# Patient Record
Sex: Male | Born: 1955 | Race: White | Hispanic: No | Marital: Married | Smoking: Never smoker
Health system: Southern US, Community
[De-identification: ages and names within clinical notes are randomized; demographics above are authoritative.]

## PROBLEM LIST (undated history)

## (undated) DIAGNOSIS — S0990XA Unspecified injury of head, initial encounter: Secondary | ICD-10-CM

## (undated) DIAGNOSIS — F1010 Alcohol abuse, uncomplicated: Secondary | ICD-10-CM

## (undated) DIAGNOSIS — M1990 Unspecified osteoarthritis, unspecified site: Secondary | ICD-10-CM

## (undated) DIAGNOSIS — Z8719 Personal history of other diseases of the digestive system: Secondary | ICD-10-CM

## (undated) HISTORY — PX: KNEE ARTHROSCOPY W/ MENISCAL REPAIR: SHX1877

## (undated) HISTORY — PX: FACIAL COSMETIC SURGERY: SHX629

---

## 2013-10-01 ENCOUNTER — Other Ambulatory Visit: Payer: Self-pay | Admitting: Internal Medicine

## 2013-10-01 DIAGNOSIS — R200 Anesthesia of skin: Secondary | ICD-10-CM

## 2013-10-01 DIAGNOSIS — M542 Cervicalgia: Secondary | ICD-10-CM

## 2013-10-01 DIAGNOSIS — R531 Weakness: Secondary | ICD-10-CM

## 2013-10-03 ENCOUNTER — Ambulatory Visit
Admission: RE | Admit: 2013-10-03 | Discharge: 2013-10-03 | Disposition: A | Discharge status: Home / Self Care | Payer: 59 | Source: Ambulatory Visit | Attending: Internal Medicine | Admitting: Internal Medicine

## 2013-10-03 DIAGNOSIS — R200 Anesthesia of skin: Secondary | ICD-10-CM

## 2013-10-03 DIAGNOSIS — R531 Weakness: Secondary | ICD-10-CM

## 2013-10-03 DIAGNOSIS — M542 Cervicalgia: Secondary | ICD-10-CM

## 2013-11-15 ENCOUNTER — Other Ambulatory Visit: Payer: Self-pay | Admitting: Neurosurgery

## 2013-11-17 NOTE — Pre-Procedure Instructions (Signed)
Dezmon Kagawa  11/17/2013   Your procedure is scheduled on:  Wednesday Nov 24, 2013 at 11:58 AM.  Report to [facility] "A"  Admitting at 9:00 AM.  Call this number if you have problems the morning of surgery: [phone]   Remember:   Do not eat food or drink liquids after midnight.   Take these medicines the morning of surgery with A SIP OF WATER: Oxycodone  STOP taking Aspirin, Goody's, BC's, Aleve (Naproxen), Ibuprofen (Advil or Motrin), Fish Oil, Vitamins, Herbal Supplements or any substance that could thin your blood starting today, November 18, 2013.   Do not wear jewelry.  Do not wear lotions, powders, or colognes. You may wear deodorant.  Men may shave face and neck.  Do not bring valuables to the hospital.  [facility] is not responsible for any belongings or valuables.               Contacts, dentures or bridgework may not be worn into surgery.  Leave suitcase in the car. After surgery it may be brought to your room.  For patients admitted to the hospital, discharge time is determined by your treatment team.               Patients discharged the day of surgery will not be allowed to drive home.  Name and phone number of your driver: Family/Friend  Special Instructions: Shower using CHG soap the night before and the morning of your surgery   Please read over the following fact sheets that you were given: Pain Booklet, Coughing and Deep Breathing, MRSA Information and Surgical Site Infection Prevention

## 2013-11-18 ENCOUNTER — Encounter (HOSPITAL_COMMUNITY)
Admission: RE | Admit: 2013-11-18 | Discharge: 2013-11-18 | Disposition: A | Discharge status: Home / Self Care | Payer: 59 | Source: Ambulatory Visit | Attending: Neurosurgery | Admitting: Neurosurgery

## 2013-11-18 ENCOUNTER — Encounter (HOSPITAL_COMMUNITY): Payer: Self-pay

## 2013-11-18 DIAGNOSIS — Z01810 Encounter for preprocedural cardiovascular examination: Secondary | ICD-10-CM | POA: Insufficient documentation

## 2013-11-18 DIAGNOSIS — Z01812 Encounter for preprocedural laboratory examination: Secondary | ICD-10-CM | POA: Insufficient documentation

## 2013-11-18 HISTORY — DX: Unspecified osteoarthritis, unspecified site: M19.90

## 2013-11-18 HISTORY — DX: Personal history of other diseases of the digestive system: Z87.19

## 2013-11-18 HISTORY — DX: Unspecified injury of head, initial encounter: S09.90XA

## 2013-11-18 HISTORY — DX: Alcohol abuse, uncomplicated: F10.10

## 2013-11-18 LAB — COMPREHENSIVE METABOLIC PANEL
ALT: 29 U/L (ref 0–53)
AST: 23 U/L (ref 0–37)
Albumin: 4.1 g/dL (ref 3.5–5.2)
Alkaline Phosphatase: 77 U/L (ref 39–117)
BUN: 15 mg/dL (ref 6–23)
CO2: 24 meq/L (ref 19–32)
CREATININE: 0.84 mg/dL (ref 0.50–1.35)
Calcium: 9.4 mg/dL (ref 8.4–10.5)
Chloride: 103 mEq/L (ref 96–112)
GLUCOSE: 102 mg/dL — AB (ref 70–99)
Potassium: 4.4 mEq/L (ref 3.7–5.3)
SODIUM: 141 meq/L (ref 137–147)
Total Bilirubin: 0.3 mg/dL (ref 0.3–1.2)
Total Protein: 7.5 g/dL (ref 6.0–8.3)

## 2013-11-18 LAB — CBC
HCT: 42 % (ref 39.0–52.0)
HEMOGLOBIN: 14.1 g/dL (ref 13.0–17.0)
MCH: 32.3 pg (ref 26.0–34.0)
MCHC: 33.6 g/dL (ref 30.0–36.0)
MCV: 96.1 fL (ref 78.0–100.0)
PLATELETS: 303 10*3/uL (ref 150–400)
RBC: 4.37 MIL/uL (ref 4.22–5.81)
RDW: 13 % (ref 11.5–15.5)
WBC: 6.8 10*3/uL (ref 4.0–10.5)

## 2013-11-18 LAB — SURGICAL PCR SCREEN
MRSA, PCR: NEGATIVE
STAPHYLOCOCCUS AUREUS: NEGATIVE

## 2013-11-18 NOTE — Progress Notes (Signed)
Called Dr. Melven Sartorius office, spoke with Janett Billow regarding pt's penicillin allergy and Ancef 2g being ordered. Per Jessica/Dr. Vertell Limber, change to Vancomycin 1g (verbal order given and placed in EPIC).

## 2013-11-18 NOTE — Progress Notes (Addendum)
Pt denies being diagnosed with hypertension but had elevated BP at PAT x2 (165/109 & 169/112). Pt reports "white coat syndrome" but reports that BP is "fine" when sees PCP and denies taking medication. EKG done and placed on chart. 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 Pt also reports daily marijuana use.  Pt reports normal stress test and ECHO being performed 5-10 years ago by Dr. Janene Madeira. Pt denies ever having chest pain or shortness of breath and has not seen a cardiologist since tests were done, will attempt to obtain records.  PCP is Dr. Trilby Drummer with [facility] [phone]).

## 2013-11-19 NOTE — Progress Notes (Signed)
Message left with [facility] to see if they have copy of stress test on this patient. Requested it to be faxed if they have or call if they do not have.

## 2013-11-23 MED ORDER — VANCOMYCIN HCL IN DEXTROSE 1-5 GM/200ML-% IV SOLN
1000.0000 mg | Freq: Once | INTRAVENOUS | Status: AC
  Administered 2013-11-24: 1000 mg via INTRAVENOUS

## 2013-11-24 ENCOUNTER — Inpatient Hospital Stay (HOSPITAL_COMMUNITY): Payer: 59

## 2013-11-24 ENCOUNTER — Encounter (HOSPITAL_COMMUNITY): Payer: Self-pay | Admitting: *Deleted

## 2013-11-24 ENCOUNTER — Encounter (HOSPITAL_COMMUNITY)
Admission: RE | Disposition: A | Discharge status: Home / Self Care | Payer: Self-pay | Source: Ambulatory Visit | Attending: Neurosurgery

## 2013-11-24 ENCOUNTER — Observation Stay (HOSPITAL_COMMUNITY)
Admission: RE | Admit: 2013-11-24 | Discharge: 2013-11-25 | Disposition: A | Discharge status: Home / Self Care | Payer: 59 | Source: Ambulatory Visit | Attending: Neurosurgery | Admitting: Neurosurgery

## 2013-11-24 ENCOUNTER — Encounter (HOSPITAL_COMMUNITY): Payer: 59 | Admitting: Anesthesiology

## 2013-11-24 ENCOUNTER — Inpatient Hospital Stay (HOSPITAL_COMMUNITY): Payer: 59 | Admitting: Anesthesiology

## 2013-11-24 DIAGNOSIS — I10 Essential (primary) hypertension: Secondary | ICD-10-CM | POA: Insufficient documentation

## 2013-11-24 DIAGNOSIS — K449 Diaphragmatic hernia without obstruction or gangrene: Secondary | ICD-10-CM | POA: Insufficient documentation

## 2013-11-24 DIAGNOSIS — M4722 Other spondylosis with radiculopathy, cervical region: Secondary | ICD-10-CM

## 2013-11-24 DIAGNOSIS — M4712 Other spondylosis with myelopathy, cervical region: Secondary | ICD-10-CM | POA: Diagnosis present

## 2013-11-24 DIAGNOSIS — K2289 Other specified disease of esophagus: Secondary | ICD-10-CM | POA: Insufficient documentation

## 2013-11-24 DIAGNOSIS — K228 Other specified diseases of esophagus: Secondary | ICD-10-CM | POA: Insufficient documentation

## 2013-11-24 DIAGNOSIS — M5000 Cervical disc disorder with myelopathy, unspecified cervical region: Secondary | ICD-10-CM | POA: Insufficient documentation

## 2013-11-24 HISTORY — PX: ANTERIOR CERVICAL DECOMP/DISCECTOMY FUSION: SHX1161

## 2013-11-24 SURGERY — ANTERIOR CERVICAL DECOMPRESSION/DISCECTOMY FUSION 3 LEVELS
Anesthesia: General | Site: Neck

## 2013-11-24 MED ORDER — PROPOFOL 10 MG/ML IV BOLUS
INTRAVENOUS | Status: AC
  Filled 2013-11-24: qty 20

## 2013-11-24 MED ORDER — ONDANSETRON HCL 4 MG/2ML IJ SOLN: 4.0000 mg | INTRAMUSCULAR | Status: DC | PRN

## 2013-11-24 MED ORDER — MENTHOL 3 MG MT LOZG: 1.0000 | LOZENGE | OROMUCOSAL | Status: DC | PRN

## 2013-11-24 MED ORDER — MIDAZOLAM HCL 5 MG/5ML IJ SOLN
INTRAMUSCULAR | Status: DC | PRN
  Administered 2013-11-24: 2 mg via INTRAVENOUS

## 2013-11-24 MED ORDER — FENTANYL CITRATE 0.05 MG/ML IJ SOLN
INTRAMUSCULAR | Status: AC
  Filled 2013-11-24: qty 5

## 2013-11-24 MED ORDER — ROCURONIUM BROMIDE 50 MG/5ML IV SOLN
INTRAVENOUS | Status: AC
  Filled 2013-11-24: qty 1

## 2013-11-24 MED ORDER — THROMBIN 20000 UNITS EX SOLR
CUTANEOUS | Status: DC | PRN
  Administered 2013-11-24: 12:00:00 via TOPICAL

## 2013-11-24 MED ORDER — DEXAMETHASONE SODIUM PHOSPHATE 10 MG/ML IJ SOLN
INTRAMUSCULAR | Status: AC
  Filled 2013-11-24: qty 1

## 2013-11-24 MED ORDER — ONDANSETRON HCL 4 MG/2ML IJ SOLN
INTRAMUSCULAR | Status: DC | PRN
  Administered 2013-11-24: 4 mg via INTRAVENOUS

## 2013-11-24 MED ORDER — SODIUM CHLORIDE 0.9 % IJ SOLN: 3.0000 mL | INTRAMUSCULAR | Status: DC | PRN

## 2013-11-24 MED ORDER — HYDROMORPHONE HCL PF 1 MG/ML IJ SOLN
0.2500 mg | INTRAMUSCULAR | Status: DC | PRN
  Administered 2013-11-24 (×4): 0.5 mg via INTRAVENOUS

## 2013-11-24 MED ORDER — GLYCOPYRROLATE 0.2 MG/ML IJ SOLN
INTRAMUSCULAR | Status: DC | PRN
  Administered 2013-11-24: 0.4 mg via INTRAVENOUS

## 2013-11-24 MED ORDER — OXYCODONE HCL 5 MG PO CAPS: 5.0000 mg | ORAL_CAPSULE | ORAL | Status: DC | PRN

## 2013-11-24 MED ORDER — DIAZEPAM 5 MG PO TABS
ORAL_TABLET | ORAL | Status: AC
  Filled 2013-11-24: qty 1

## 2013-11-24 MED ORDER — HYDROCODONE-ACETAMINOPHEN 5-325 MG PO TABS
1.0000 | ORAL_TABLET | ORAL | Status: DC | PRN
  Administered 2013-11-25: 2 via ORAL
  Filled 2013-11-24: qty 2

## 2013-11-24 MED ORDER — DIAZEPAM 5 MG PO TABS
5.0000 mg | ORAL_TABLET | Freq: Four times a day (QID) | ORAL | Status: DC | PRN
  Administered 2013-11-24 – 2013-11-25 (×4): 5 mg via ORAL
  Filled 2013-11-24 (×3): qty 1

## 2013-11-24 MED ORDER — PROPOFOL 10 MG/ML IV BOLUS
INTRAVENOUS | Status: DC | PRN
  Administered 2013-11-24: 200 mg via INTRAVENOUS

## 2013-11-24 MED ORDER — ALUM & MAG HYDROXIDE-SIMETH 200-200-20 MG/5ML PO SUSP: 30.0000 mL | Freq: Four times a day (QID) | ORAL | Status: DC | PRN

## 2013-11-24 MED ORDER — ONDANSETRON HCL 4 MG/2ML IJ SOLN
INTRAMUSCULAR | Status: AC
  Filled 2013-11-24: qty 2

## 2013-11-24 MED ORDER — LACTATED RINGERS IV SOLN
INTRAVENOUS | Status: DC
  Administered 2013-11-24 (×2): via INTRAVENOUS

## 2013-11-24 MED ORDER — NEOSTIGMINE METHYLSULFATE 10 MG/10ML IV SOLN
INTRAVENOUS | Status: DC | PRN
  Administered 2013-11-24: 3 mg via INTRAVENOUS

## 2013-11-24 MED ORDER — OXYCODONE-ACETAMINOPHEN 5-325 MG PO TABS
ORAL_TABLET | ORAL | Status: AC
  Filled 2013-11-24: qty 2

## 2013-11-24 MED ORDER — LIDOCAINE HCL (CARDIAC) 20 MG/ML IV SOLN
INTRAVENOUS | Status: DC | PRN
  Administered 2013-11-24: 40 mg via INTRAVENOUS

## 2013-11-24 MED ORDER — LIDOCAINE HCL 4 % MT SOLN
OROMUCOSAL | Status: DC | PRN
  Administered 2013-11-24: 4 mL via TOPICAL

## 2013-11-24 MED ORDER — ONDANSETRON HCL 4 MG/2ML IJ SOLN: 4.0000 mg | Freq: Once | INTRAMUSCULAR | Status: DC | PRN

## 2013-11-24 MED ORDER — ACETAMINOPHEN 325 MG PO TABS: 650.0000 mg | ORAL_TABLET | ORAL | Status: DC | PRN

## 2013-11-24 MED ORDER — PANTOPRAZOLE SODIUM 40 MG PO TBEC
40.0000 mg | DELAYED_RELEASE_TABLET | Freq: Every day | ORAL | Status: DC
  Administered 2013-11-24 – 2013-11-25 (×2): 40 mg via ORAL
  Filled 2013-11-24 (×2): qty 1

## 2013-11-24 MED ORDER — KCL IN DEXTROSE-NACL 20-5-0.45 MEQ/L-%-% IV SOLN
INTRAVENOUS | Status: DC
  Filled 2013-11-24 (×3): qty 1000

## 2013-11-24 MED ORDER — OXYCODONE-ACETAMINOPHEN 5-325 MG PO TABS
1.0000 | ORAL_TABLET | ORAL | Status: DC | PRN
  Administered 2013-11-24 – 2013-11-25 (×4): 2 via ORAL
  Filled 2013-11-24 (×3): qty 2

## 2013-11-24 MED ORDER — THROMBIN 5000 UNITS EX SOLR
OROMUCOSAL | Status: DC | PRN
  Administered 2013-11-24: 13:00:00 via TOPICAL

## 2013-11-24 MED ORDER — NEOSTIGMINE METHYLSULFATE 10 MG/10ML IV SOLN
INTRAVENOUS | Status: AC
  Filled 2013-11-24: qty 1

## 2013-11-24 MED ORDER — SENNA 8.6 MG PO TABS
1.0000 | ORAL_TABLET | Freq: Two times a day (BID) | ORAL | Status: DC
  Administered 2013-11-24 – 2013-11-25 (×2): 8.6 mg via ORAL
  Filled 2013-11-24 (×3): qty 1

## 2013-11-24 MED ORDER — DOCUSATE SODIUM 100 MG PO CAPS
100.0000 mg | ORAL_CAPSULE | Freq: Two times a day (BID) | ORAL | Status: DC
  Administered 2013-11-24 – 2013-11-25 (×2): 100 mg via ORAL
  Filled 2013-11-24 (×3): qty 1

## 2013-11-24 MED ORDER — LIDOCAINE HCL (CARDIAC) 20 MG/ML IV SOLN
INTRAVENOUS | Status: AC
  Filled 2013-11-24: qty 5

## 2013-11-24 MED ORDER — HYDROMORPHONE HCL PF 1 MG/ML IJ SOLN
INTRAMUSCULAR | Status: AC
  Filled 2013-11-24: qty 1

## 2013-11-24 MED ORDER — SODIUM CHLORIDE 0.9 % IJ SOLN
3.0000 mL | Freq: Two times a day (BID) | INTRAMUSCULAR | Status: DC
  Administered 2013-11-24: 3 mL via INTRAVENOUS

## 2013-11-24 MED ORDER — BUPIVACAINE HCL (PF) 0.5 % IJ SOLN
INTRAMUSCULAR | Status: DC | PRN
  Administered 2013-11-24: 5 mL

## 2013-11-24 MED ORDER — MORPHINE SULFATE 2 MG/ML IJ SOLN
1.0000 mg | INTRAMUSCULAR | Status: DC | PRN
  Administered 2013-11-24: 4 mg via INTRAVENOUS
  Administered 2013-11-25: 2 mg via INTRAVENOUS
  Filled 2013-11-24: qty 2
  Filled 2013-11-24: qty 1

## 2013-11-24 MED ORDER — FLEET ENEMA 7-19 GM/118ML RE ENEM
1.0000 | ENEMA | Freq: Once | RECTAL | Status: AC | PRN
  Filled 2013-11-24: qty 1

## 2013-11-24 MED ORDER — GLYCOPYRROLATE 0.2 MG/ML IJ SOLN
INTRAMUSCULAR | Status: AC
  Filled 2013-11-24: qty 3

## 2013-11-24 MED ORDER — BISACODYL 10 MG RE SUPP: 10.0000 mg | Freq: Every day | RECTAL | Status: DC | PRN

## 2013-11-24 MED ORDER — OXYCODONE HCL 5 MG PO TABS: 5.0000 mg | ORAL_TABLET | ORAL | Status: DC | PRN

## 2013-11-24 MED ORDER — PHENOL 1.4 % MT LIQD
1.0000 | OROMUCOSAL | Status: DC | PRN
  Filled 2013-11-24: qty 177

## 2013-11-24 MED ORDER — VANCOMYCIN HCL IN DEXTROSE 1-5 GM/200ML-% IV SOLN
INTRAVENOUS | Status: AC
  Filled 2013-11-24: qty 200

## 2013-11-24 MED ORDER — ROCURONIUM BROMIDE 100 MG/10ML IV SOLN
INTRAVENOUS | Status: DC | PRN
  Administered 2013-11-24: 50 mg via INTRAVENOUS
  Administered 2013-11-24: 20 mg via INTRAVENOUS

## 2013-11-24 MED ORDER — FENTANYL CITRATE 0.05 MG/ML IJ SOLN
INTRAMUSCULAR | Status: DC | PRN
  Administered 2013-11-24 (×3): 50 ug via INTRAVENOUS
  Administered 2013-11-24: 150 ug via INTRAVENOUS
  Administered 2013-11-24: 50 ug via INTRAVENOUS

## 2013-11-24 MED ORDER — 0.9 % SODIUM CHLORIDE (POUR BTL) OPTIME
TOPICAL | Status: DC | PRN
  Administered 2013-11-24: 1000 mL

## 2013-11-24 MED ORDER — LIDOCAINE-EPINEPHRINE 1 %-1:100000 IJ SOLN
INTRAMUSCULAR | Status: DC | PRN
  Administered 2013-11-24: 5 mL

## 2013-11-24 MED ORDER — MIDAZOLAM HCL 2 MG/2ML IJ SOLN
INTRAMUSCULAR | Status: AC
  Filled 2013-11-24: qty 2

## 2013-11-24 MED ORDER — ACETAMINOPHEN 650 MG RE SUPP: 650.0000 mg | RECTAL | Status: DC | PRN

## 2013-11-24 MED ORDER — PANTOPRAZOLE SODIUM 40 MG IV SOLR
40.0000 mg | Freq: Every day | INTRAVENOUS | Status: DC
  Filled 2013-11-24: qty 40

## 2013-11-24 MED ORDER — DEXAMETHASONE SODIUM PHOSPHATE 10 MG/ML IJ SOLN
INTRAMUSCULAR | Status: DC | PRN
  Administered 2013-11-24: 10 mg via INTRAVENOUS

## 2013-11-24 MED ORDER — POLYETHYLENE GLYCOL 3350 17 G PO PACK
17.0000 g | PACK | Freq: Every day | ORAL | Status: DC | PRN
  Filled 2013-11-24: qty 1

## 2013-11-24 SURGICAL SUPPLY — 82 items
BAG DECANTER FOR FLEXI CONT (MISCELLANEOUS) ×2 IMPLANT
BANDAGE GAUZE ELAST BULKY 4 IN (GAUZE/BANDAGES/DRESSINGS) ×4 IMPLANT
BENZOIN TINCTURE PRP APPL 2/3 (GAUZE/BANDAGES/DRESSINGS) IMPLANT
BIT DRILL NEURO 2X3.1 SFT TUCH (MISCELLANEOUS) ×1 IMPLANT
BIT DRILL POWER (BIT) ×1 IMPLANT
BLADE 10 SAFETY STRL DISP (BLADE) IMPLANT
BLADE ULTRA TIP 2M (BLADE) ×2 IMPLANT
BONE MATRIX OSTEOCEL PRO SM (Bone Implant) ×4 IMPLANT
BUR BARREL STRAIGHT FLUTE 4.0 (BURR) ×2 IMPLANT
CAGE SMALL 7X13X15 (Cage) ×6 IMPLANT
CANISTER SUCT 3000ML (MISCELLANEOUS) ×2 IMPLANT
CONT SPEC 4OZ CLIKSEAL STRL BL (MISCELLANEOUS) ×2 IMPLANT
COVER MAYO STAND STRL (DRAPES) ×2 IMPLANT
DERMABOND ADHESIVE PROPEN (GAUZE/BANDAGES/DRESSINGS) ×1
DERMABOND ADVANCED (GAUZE/BANDAGES/DRESSINGS)
DERMABOND ADVANCED .7 DNX12 (GAUZE/BANDAGES/DRESSINGS) IMPLANT
DERMABOND ADVANCED .7 DNX6 (GAUZE/BANDAGES/DRESSINGS) ×1 IMPLANT
DRAIN CHANNEL 7F 3/4 FLAT (WOUND CARE) ×2 IMPLANT
DRAIN SNY WOU 7FLT (WOUND CARE) ×2 IMPLANT
DRAPE LAPAROTOMY 100X72 PEDS (DRAPES) ×2 IMPLANT
DRAPE MICROSCOPE LEICA (MISCELLANEOUS) ×2 IMPLANT
DRAPE POUCH INSTRU U-SHP 10X18 (DRAPES) ×2 IMPLANT
DRAPE PROXIMA HALF (DRAPES) ×2 IMPLANT
DRESSING TELFA 8X3 (GAUZE/BANDAGES/DRESSINGS) IMPLANT
DRILL BIT POWER (BIT) ×1
DRILL NEURO 2X3.1 SOFT TOUCH (MISCELLANEOUS) ×2
DRSG OPSITE 4X5.5 SM (GAUZE/BANDAGES/DRESSINGS) ×2 IMPLANT
DURAPREP 6ML APPLICATOR 50/CS (WOUND CARE) ×2 IMPLANT
ELECT COATED BLADE 2.86 ST (ELECTRODE) ×2 IMPLANT
ELECT REM PT RETURN 9FT ADLT (ELECTROSURGICAL) ×2
ELECTRODE REM PT RTRN 9FT ADLT (ELECTROSURGICAL) ×1 IMPLANT
EVACUATOR SILICONE 100CC (DRAIN) ×2 IMPLANT
GAUZE SPONGE 4X4 16PLY XRAY LF (GAUZE/BANDAGES/DRESSINGS) IMPLANT
GLOVE BIO SURGEON STRL SZ8 (GLOVE) ×4 IMPLANT
GLOVE BIOGEL PI IND STRL 7.5 (GLOVE) ×3 IMPLANT
GLOVE BIOGEL PI IND STRL 8 (GLOVE) ×1 IMPLANT
GLOVE BIOGEL PI IND STRL 8.5 (GLOVE) ×1 IMPLANT
GLOVE BIOGEL PI INDICATOR 7.5 (GLOVE) ×3
GLOVE BIOGEL PI INDICATOR 8 (GLOVE) ×1
GLOVE BIOGEL PI INDICATOR 8.5 (GLOVE) ×1
GLOVE ECLIPSE 7.5 STRL STRAW (GLOVE) ×6 IMPLANT
GLOVE ECLIPSE 8.0 STRL XLNG CF (GLOVE) ×2 IMPLANT
GLOVE EXAM NITRILE LRG STRL (GLOVE) ×2 IMPLANT
GLOVE EXAM NITRILE MD LF STRL (GLOVE) IMPLANT
GLOVE EXAM NITRILE XL STR (GLOVE) IMPLANT
GLOVE EXAM NITRILE XS STR PU (GLOVE) IMPLANT
GLOVE INDICATOR 8.5 STRL (GLOVE) ×2 IMPLANT
GLOVE SURG SS PI 7.0 STRL IVOR (GLOVE) ×4 IMPLANT
GOWN BRE IMP SLV AUR LG STRL (GOWN DISPOSABLE) IMPLANT
GOWN BRE IMP SLV AUR XL STRL (GOWN DISPOSABLE) IMPLANT
GOWN STRL REIN 2XL LVL4 (GOWN DISPOSABLE) IMPLANT
GOWN STRL REUS W/ TWL LRG LVL3 (GOWN DISPOSABLE) ×1 IMPLANT
GOWN STRL REUS W/ TWL XL LVL3 (GOWN DISPOSABLE) ×2 IMPLANT
GOWN STRL REUS W/TWL 2XL LVL3 (GOWN DISPOSABLE) ×4 IMPLANT
GOWN STRL REUS W/TWL LRG LVL3 (GOWN DISPOSABLE) ×1
GOWN STRL REUS W/TWL XL LVL3 (GOWN DISPOSABLE) ×2
HALTER HD/CHIN CERV TRACTION D (MISCELLANEOUS) ×2 IMPLANT
KIT BASIN OR (CUSTOM PROCEDURE TRAY) ×2 IMPLANT
KIT ROOM TURNOVER OR (KITS) ×2 IMPLANT
NEEDLE HYPO 25X1 1.5 SAFETY (NEEDLE) ×2 IMPLANT
NEEDLE SPNL 18GX3.5 QUINCKE PK (NEEDLE) IMPLANT
NEEDLE SPNL 22GX3.5 QUINCKE BK (NEEDLE) ×2 IMPLANT
NS IRRIG 1000ML POUR BTL (IV SOLUTION) ×2 IMPLANT
PACK LAMINECTOMY NEURO (CUSTOM PROCEDURE TRAY) ×2 IMPLANT
PAD ARMBOARD 7.5X6 YLW CONV (MISCELLANEOUS) ×6 IMPLANT
PIN DISTRACTION 14MM (PIN) ×4 IMPLANT
PLATE ARCHON 3-LEVEL 62MM (Plate) ×2 IMPLANT
RUBBERBAND STERILE (MISCELLANEOUS) ×4 IMPLANT
SCREW ARCHON SELFTAP 4.0X13 (Screw) ×16 IMPLANT
SPONGE GAUZE 4X4 12PLY (GAUZE/BANDAGES/DRESSINGS) ×2 IMPLANT
SPONGE INTESTINAL PEANUT (DISPOSABLE) ×4 IMPLANT
SPONGE SURGIFOAM ABS GEL 100 (HEMOSTASIS) ×2 IMPLANT
STAPLER SKIN PROX WIDE 3.9 (STAPLE) ×2 IMPLANT
STRIP CLOSURE SKIN 1/2X4 (GAUZE/BANDAGES/DRESSINGS) IMPLANT
SUT VIC AB 3-0 SH 8-18 (SUTURE) ×4 IMPLANT
SYR 20ML ECCENTRIC (SYRINGE) ×2 IMPLANT
SYR 5ML LL (SYRINGE) IMPLANT
TOWEL OR 17X24 6PK STRL BLUE (TOWEL DISPOSABLE) ×2 IMPLANT
TOWEL OR 17X26 10 PK STRL BLUE (TOWEL DISPOSABLE) ×2 IMPLANT
TRAP SPECIMEN MUCOUS 40CC (MISCELLANEOUS) ×2 IMPLANT
TRAY FOLEY CATH 14FRSI W/METER (CATHETERS) IMPLANT
WATER STERILE IRR 1000ML POUR (IV SOLUTION) ×2 IMPLANT

## 2013-11-24 NOTE — Brief Op Note (Signed)
11/24/2013  1:52 PM  PATIENT:  Kurt Avila  58 y.o. 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  PROCEDURE:  Procedure(s) with comments: Cervical three-four, Cervical four-five,  Cervical five-six Anterior cervical decompression/diskectomy/fusion (N/A) - Cervical three-four, Cervical four-five,  Cervical five-six Anterior cervical decompression/diskectomy/fusion  SURGEON:  Surgeon(s) and Role:    * Erline Levine, MD - Primary    * Elaina Hoops, MD - Assisting  PHYSICIAN ASSISTANT:   ASSISTANTS: Poteat, RN   ANESTHESIA:   general  EBL:  Total I/O In: 1000 [I.V.:1000] Out: 200 [Blood:200]  BLOOD ADMINISTERED:none  DRAINS: none and (7) Jackson-Pratt drain(s) with closed bulb suction in the prevertebral   LOCAL MEDICATIONS USED:  XYLOCAINE   SPECIMEN:  No Specimen  DISPOSITION OF SPECIMEN:  N/A  COUNTS:  YES  TOURNIQUET:  * No tourniquets in log *  DICTATION: DICTATION: Patient was brought to operating room and following the smooth and uncomplicated induction of general endotracheal anesthesia his head was placed on a horseshoe head holder he was placed in 5 pounds of Holter traction and his anterior neck was prepped and draped in usual sterile fashion. An incision was made on the left side of midline after infiltrating the skin and subcutaneous tissues with local lidocaine. The platysmal layer was incised and subplatysmal dissection was performed exposing the anterior border sternocleidomastoid muscle. Using blunt dissection the carotid sheath was kept lateral and trachea and esophagus kept medial exposing the anterior cervical spine. A bent spinal needle was placed it  was felt to be the C 34 and C 45 levels and this was confirmed on intraoperative x-ray. Longus coli muscles were taken down from the anterior cervical spine using electrocautery and key elevator and self-retaining retractor was placed. The interspace at C 34 was incised and a thorough discectomy was performed. Distraction pins were placed. Uncinate spurs and central spondylitic ridges were drilled down with a high-speed drill. The spinal cord dura and both C4 nerve roots were widely decompressed. Hemostasis was assured. After trial sizing a lordotic 7 mm peek interbody cage was selected and packed with allograft and autograft. The graft was tamped into position and countersunk appropriately. The retractor was moved and the interspace at C 45 was incised and a thorough discectomy was performed. Distraction pins were placed. Uncinate spurs and central spondylitic ridges were drilled down with a high-speed drill. The spinal cord dura and both C6 nerve roots were widely decompressed. Hemostasis was assured. After trial sizing an 7 mm peek interbody cage was selected and packed in a similar fashion. The graft was tamped into position and countersunk appropriately.The interspace at C 56was incised and a thorough discectomy was performed. Distraction pins were placed. Uncinate spurs and central spondylitic ridges were drilled down with a high-speed drill. The spinal cord dura and both C 6 nerve roots were widely decompressed. Marland Kitchen Hemostasis was assured. After trial sizing a 7 mm peek interbody cage was selected and packed in a similar fashion. The graft was tamped into position and countersunk appropriately.  Distraction weight was removed. A 62 mm Nuvasive Archon anterior cervical plate was affixed to the cervical spine with 13 mm variable-angle screws 2 at C3, 2 at C4, 2 at C5,  and 2 at C6. All  screws were well-positioned and locking mechanisms were engaged. Soft tissues were inspected and found to be in good repair. The  wound was irrigated. A final x-ray was obtained with good visualization to C 5 with the interbody graft well visualized. A # 7 JP drain was placed in the prevertebral space. The platysma layer was closed with 3-0 Vicryl stitches and the skin was reapproximated with 3-0 Vicryl subcuticular stitches. The wound was dressed with Dermabond. Counts were correct at the end of the case. Patient was extubated and taken to recovery in stable and satisfactory condition.     PLAN OF CARE: Admit for overnight observation  PATIENT DISPOSITION:  PACU - hemodynamically stable.   Delay start of Pharmacological VTE agent (>24hrs) due to surgical blood loss or risk of bleeding: yes

## 2013-11-24 NOTE — Anesthesia Preprocedure Evaluation (Signed)
Anesthesia Evaluation  Patient identified by MRN, date of birth, ID band Patient awake    Reviewed: Allergy & Precautions, H&P , NPO status , Patient's Chart, lab work & pertinent test results  Airway       Dental   Pulmonary          Cardiovascular hypertension,     Neuro/Psych    GI/Hepatic hiatal hernia, (+)     substance abuse  alcohol use,   Endo/Other    Renal/GU      Musculoskeletal   Abdominal   Peds  Hematology   Anesthesia Other Findings   Reproductive/Obstetrics                           Anesthesia Physical Anesthesia Plan  ASA: II  Anesthesia Plan: General   Post-op Pain Management:    Induction: Intravenous  Airway Management Planned: Oral ETT  Additional Equipment:   Intra-op Plan:   Post-operative Plan: Extubation in OR  Informed Consent: I have reviewed the patients History and Physical, chart, labs and discussed the procedure including the risks, benefits and alternatives for the proposed anesthesia with the patient or authorized representative who has indicated his/her understanding and acceptance.     Plan Discussed with:   Anesthesia Plan Comments:         Anesthesia Quick Evaluation

## 2013-11-24 NOTE — Progress Notes (Signed)
Awake, alert, conversant.  Full strength bilateral D/B/T.  Doing well.

## 2013-11-24 NOTE — Anesthesia Postprocedure Evaluation (Signed)
  Anesthesia Post-op Note  Patient: Kurt Avila  Procedure(s) Performed: Procedure(s) with comments: Cervical three-four, Cervical four-five,  Cervical five-six Anterior cervical decompression/diskectomy/fusion (N/A) - Cervical three-four, Cervical four-five,  Cervical five-six Anterior cervical decompression/diskectomy/fusion  Patient Location: PACU  Anesthesia Type:General  Level of Consciousness: awake, alert , oriented and patient cooperative  Airway and Oxygen Therapy: Patient Spontanous Breathing  Post-op Pain: mild  Post-op Assessment: Post-op Vital signs reviewed, Patient's Cardiovascular Status Stable, Respiratory Function Stable, Patent Airway, No signs of Nausea or vomiting and Pain level controlled  Post-op Vital Signs: stable  Last Vitals:  Filed Vitals:   11/24/13 1506  BP:   Pulse: 70  Temp:   Resp: 13    Complications: No apparent anesthesia complications
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
## 2013-11-24 NOTE — Interval H&P Note (Signed)
History and Physical Interval Note:  11/24/2013 8:30 AM  Kurt Avila  has presented today for surgery, with the diagnosis of Cervicalgia, Cervical spondylosis  The various methods of treatment have been discussed with the patient and family. After consideration of risks, benefits and other options for treatment, the patient has consented to  Procedure(s) with comments: ANTERIOR CERVICAL DECOMPRESSION/DISCECTOMY FUSION 3 LEVELS (N/A) - C3-4 C4-5 C5-6 Anterior cervical decompression/diskectomy/fusion as a surgical intervention .  The patient's history has been reviewed, patient examined, no change in status, stable for surgery.  I have reviewed the patient's chart and labs.  Questions were answered to the patient's satisfaction.     Erline Levine

## 2013-11-24 NOTE — Progress Notes (Signed)
Pt. With rt. Neck swelling noted, Dr Saintclair Halsted and Dr Vertell Limber here, ice to area

## 2013-11-24 NOTE — Op Note (Signed)
11/24/2013  1:52 PM  PATIENT:  Kurt Avila  58 y.o. 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  PROCEDURE:  Procedure(s) with comments: Cervical three-four, Cervical four-five,  Cervical five-six Anterior cervical decompression/diskectomy/fusion (N/A) - Cervical three-four, Cervical four-five,  Cervical five-six Anterior cervical decompression/diskectomy/fusion  SURGEON:  Surgeon(s) and Role:    * Erline Levine, MD - Primary    * Elaina Hoops, MD - Assisting  PHYSICIAN ASSISTANT:   ASSISTANTS: Poteat, RN   ANESTHESIA:   general  EBL:  Total I/O In: 1000 [I.V.:1000] Out: 200 [Blood:200]  BLOOD ADMINISTERED:none  DRAINS: none and (7) Jackson-Pratt drain(s) with closed bulb suction in the prevertebral   LOCAL MEDICATIONS USED:  XYLOCAINE   SPECIMEN:  No Specimen  DISPOSITION OF SPECIMEN:  N/A  COUNTS:  YES  TOURNIQUET:  * No tourniquets in log *  DICTATION: DICTATION: Patient was brought to operating room and following the smooth and uncomplicated induction of general endotracheal anesthesia his head was placed on a horseshoe head holder he was placed in 5 pounds of Holter traction and his anterior neck was prepped and draped in usual sterile fashion. An incision was made on the left side of midline after infiltrating the skin and subcutaneous tissues with local lidocaine. The platysmal layer was incised and subplatysmal dissection was performed exposing the anterior border sternocleidomastoid muscle. Using blunt dissection the carotid sheath was kept lateral and trachea and esophagus kept medial exposing the anterior cervical spine. A bent spinal needle was placed it  was felt to be the C 34 and C 45 levels and this was confirmed on intraoperative x-ray. Longus coli muscles were taken down from the anterior cervical spine using electrocautery and key elevator and self-retaining retractor was placed. The interspace at C 34 was incised and a thorough discectomy was performed. Distraction pins were placed. Uncinate spurs and central spondylitic ridges were drilled down with a high-speed drill. The spinal cord dura and both C4 nerve roots were widely decompressed. Hemostasis was assured. After trial sizing a lordotic 7 mm peek interbody cage was selected and packed with allograft and autograft. The graft was tamped into position and countersunk appropriately. The retractor was moved and the interspace at C 45 was incised and a thorough discectomy was performed. Distraction pins were placed. Uncinate spurs and central spondylitic ridges were drilled down with a high-speed drill. The spinal cord dura and both C6 nerve roots were widely decompressed. Hemostasis was assured. After trial sizing an 7 mm peek interbody cage was selected and packed in a similar fashion. The graft was tamped into position and countersunk appropriately.The interspace at C 56was incised and a thorough discectomy was performed. Distraction pins were placed. Uncinate spurs and central spondylitic ridges were drilled down with a high-speed drill. The spinal cord dura and both C 6 nerve roots were widely decompressed. Marland Kitchen Hemostasis was assured. After trial sizing a 7 mm peek interbody cage was selected and packed in a similar fashion. The graft was tamped into position and countersunk appropriately.  Distraction weight was removed. A 62 mm Nuvasive Archon anterior cervical plate was affixed to the cervical spine with 13 mm variable-angle screws 2 at C3, 2 at C4, 2 at C5,  and 2 at C6. All  screws were well-positioned and locking mechanisms were engaged. Soft tissues were inspected and found to be in good repair. The  wound was irrigated. A final x-ray was obtained with good visualization to C 5 with the interbody graft well visualized. A # 7 JP drain was placed in the prevertebral space. The platysma layer was closed with 3-0 Vicryl stitches and the skin was reapproximated with 3-0 Vicryl subcuticular stitches. The wound was dressed with Dermabond. Counts were correct at the end of the case. Patient was extubated and taken to recovery in stable and satisfactory condition.     PLAN OF CARE: Admit for overnight observation  PATIENT DISPOSITION:  PACU - hemodynamically stable.   Delay start of Pharmacological VTE agent (>24hrs) due to surgical blood loss or risk of bleeding: yes

## 2013-11-24 NOTE — Anesthesia Procedure Notes (Signed)
Procedure Name: Intubation Date/Time: 11/24/2013 10:54 AM Performed by: Rush Farmer E Pre-anesthesia Checklist: Patient identified, Emergency Drugs available, Suction available, Patient being monitored and Timeout performed Patient Re-evaluated:Patient Re-evaluated prior to inductionOxygen Delivery Method: Circle system utilized Preoxygenation: Pre-oxygenation with 100% oxygen Intubation Type: IV induction Ventilation: Mask ventilation without difficulty Laryngoscope Size: Mac and 3 Grade View: Grade III Tube type: Oral Tube size: 7.5 mm Number of attempts: 1 Airway Equipment and Method: Stylet and LTA kit utilized Placement Confirmation: positive ETCO2 and breath sounds checked- equal and bilateral Secured at: 23 cm Tube secured with: Tape Dental Injury: Teeth and Oropharynx as per pre-operative assessment

## 2013-11-24 NOTE — Transfer of Care (Signed)
Immediate Anesthesia Transfer of Care Note  Patient: Kurt Avila  Procedure(s) Performed: Procedure(s) with comments: Cervical three-four, Cervical four-five,  Cervical five-six Anterior cervical decompression/diskectomy/fusion (N/A) - Cervical three-four, Cervical four-five,  Cervical five-six Anterior cervical decompression/diskectomy/fusion  Patient Location: PACU  Anesthesia Type:General  Level of Consciousness: awake, alert  and oriented  Airway & Oxygen Therapy: Patient Spontanous Breathing and Patient connected to nasal cannula oxygen  Post-op Assessment: Report given to PACU RN, Post -op Vital signs reviewed and stable and Patient moving all extremities X 4  Post vital signs: Reviewed and stable  Complications: No apparent anesthesia complications

## 2013-11-24 NOTE — Plan of Care (Signed)
Problem: Consults Goal: Diagnosis - Spinal Surgery Outcome: Completed/Met Date Met:  11/24/13 Cervical Spine Fusion     

## 2013-11-25 ENCOUNTER — Encounter (HOSPITAL_COMMUNITY): Payer: Self-pay | Admitting: Neurosurgery

## 2013-11-25 MED ORDER — METHOCARBAMOL 500 MG PO TABS: 500.0000 mg | ORAL_TABLET | Freq: Three times a day (TID) | ORAL | Status: DC | PRN

## 2013-11-25 MED ORDER — OXYCODONE HCL 5 MG PO TABS: 5.0000 mg | ORAL_TABLET | ORAL | Status: DC | PRN

## 2013-11-25 NOTE — Discharge Instructions (Signed)

## 2013-11-25 NOTE — Evaluation (Signed)
Physical Therapy Evaluation/ Discharge Patient Details Name: Kurt Avila MRN: [id number] DOB: 1955/07/28 Today's Date: 11/25/2013   History of Present Illness  ACDF C3-6  Clinical Impression  Pt very pleasant and used to being independent and not asking for help. Pt reports LUE tingling different then prior to sx but without weakness or variation in sensation currently. Pt educated for all restrictions and precautions with use of handout and able to verbalize and return demo understanding with wife present end of session for education as well. Pt with all questions answered and education provided without further therapy needs and will sign off with pt in agreement.     Follow Up Recommendations No PT follow up    Equipment Recommendations  None recommended by PT    Recommendations for Other Services       Precautions / Restrictions Precautions Precautions: Cervical Required Braces or Orthoses: Cervical Brace Cervical Brace: At all times;Soft collar      Mobility  Bed Mobility Overal bed mobility: Needs Assistance Bed Mobility: Sidelying to Sit;Sit to Sidelying   Sidelying to sit: Supervision     Sit to sidelying: Supervision General bed mobility comments: cueing for sequence with pt able to return demonstrate  Transfers Overall transfer level: Modified independent                  Ambulation/Gait Ambulation/Gait assistance: Modified independent (Device/Increase time) Ambulation Distance (Feet): 400 Feet Assistive device: None Gait Pattern/deviations: WFL(Within Functional Limits)   Gait velocity interpretation: at or above normal speed for age/gender    Stairs Stairs: Yes Stairs assistance: Modified independent (Device/Increase time) Stair Management: One rail Right;Alternating pattern;Forwards Number of Stairs: 5    Wheelchair Mobility    Modified Rankin (Stroke Patients Only)       Balance Overall balance assessment: No apparent balance  deficits (not formally assessed)                                           Pertinent Vitals/Pain 3/10 LUE tingling, repositioned    Home Living Family/patient expects to be discharged to:: Private residence Living Arrangements: Spouse/significant other Available Help at Discharge: Family Type of Home: House Home Access: Stairs to enter Entrance Stairs-Rails: Right Entrance Stairs-Number of Steps: 5 Home Layout: One level Home Equipment: None      Prior Function Level of Independence: Independent               Hand Dominance        Extremity/Trunk Assessment   Upper Extremity Assessment: Overall WFL for tasks assessed;LUE deficits/detail       LUE Deficits / Details: tingling sensation LUE but able to sense light touch equally with RUE, strength equal bilaterally did not assess ROM outside restrictions   Lower Extremity Assessment: Overall WFL for tasks assessed      Cervical / Trunk Assessment: Normal  Communication   Communication: No difficulties  Cognition Arousal/Alertness: Awake/alert Behavior During Therapy: WFL for tasks assessed/performed Overall Cognitive Status: Within Functional Limits for tasks assessed                      General Comments      Exercises        Assessment/Plan    PT Assessment Patent does not need any further PT services  PT Diagnosis     PT Problem List  PT Treatment Interventions     PT Goals (Current goals can be found in the Care Plan section) Acute Rehab PT Goals PT Goal Formulation: No goals set, d/c therapy    Frequency     Barriers to discharge        Co-evaluation               End of Session Equipment Utilized During Treatment: Cervical collar Activity Tolerance: Patient tolerated treatment well Patient left: in chair;with call bell/phone within reach;with family/visitor present Nurse Communication: Mobility status    Functional Assessment Tool Used: clinical  judgement Functional Limitation: Mobility: Walking and moving around Mobility: Walking and Moving Around Current Status ([id number]): At least 1 percent but less than 20 percent impaired, limited or restricted Mobility: Walking and Moving Around Goal Status [phone]): At least 1 percent but less than 20 percent impaired, limited or restricted Mobility: Walking and Moving Around Discharge Status [phone]): At least 1 percent but less than 20 percent impaired, limited or restricted    Time: 0751-0807 PT Time Calculation (min): 16 min   Charges:   PT Evaluation $Initial PT Evaluation Tier I: 1 Procedure PT Treatments $Therapeutic Activity: 8-22 mins   PT G Codes:   Functional Assessment Tool Used: clinical judgement Functional Limitation: Mobility: Walking and moving around    [employer] 11/25/2013, 8:43 AM Elwyn Reach, [place]

## 2013-11-25 NOTE — Progress Notes (Signed)
Subjective: Patient reports doing well  Objective: Vital signs in last 24 hours: Temp:  [97 F (36.1 C)-98.7 F (37.1 C)] 98.3 F (36.8 C) (05/07 0830) Pulse Rate:  [62-88] 62 (05/07 0830) Resp:  [12-20] 18 (05/07 0830) BP: (140-179)/(76-99) 164/99 mmHg (05/07 0830) SpO2:  [89 %-100 %] 97 % (05/07 0830) Weight:  [92.08 kg (203 lb)] 92.08 kg (203 lb) (05/06 0907)  Intake/Output from previous day: 05/06 0701 - 05/07 0700 In: 1250 [I.V.:1250] Out: 260 [Drains:60; Blood:200] Intake/Output this shift:    Physical Exam: Dressing CDI.  Strength full B D/B/T.  Ambulating without difficulty  Lab Results: No results found for this basename: WBC, HGB, HCT, PLT,  in the last 72 hours BMET No results found for this basename: NA, K, CL, CO2, GLUCOSE, BUN, CREATININE, CALCIUM,  in the last 72 hours  Studies/Results: Dg Cervical Spine 2-3 Views  11/24/2013   CLINICAL DATA:  Anterior cervical fusion.  EXAM: CERVICAL SPINE - 2-3 VIEW  COMPARISON:  MR C SPINE W/O CM dated 10/03/2013  FINDINGS: 2 intraoperative cross-table lateral views of the cervical spine are submitted. The first image, taken at 1125 hr, shows surgical instrument tips projecting at the C3-4 and C4-5 interspaces.  The second image, taken at 1315 hr, shows anterior cervical fusion from C3 through C6 with interbody spacers.  IMPRESSION: Intraoperative visualization for C3-6 anterior cervical fusion.   Electronically Signed   By: Lorin Picket M.D.   On: 11/24/2013 14:54    Assessment/Plan: Doing well.  D/C home    LOS: 1 day    Erline Levine, MD 11/25/2013, 8:36 AM

## 2013-11-25 NOTE — Discharge Summary (Signed)
Physician Discharge Summary  Patient ID: Kurt Avila MRN: [id number] DOB/AGE: 09/26/1955 58 y.o.  Admit date: 11/24/2013 Discharge date: 11/25/2013  Admission Diagnoses:Cervical spondylosis with radiculopathy C 34, C 45, C 56  Discharge Diagnoses: Cervical spondylosis with radiculopathy C 34, C 45, C 56 Active Problems:   Cervical spondylosis with myelopathy and radiculopathy   Discharged Condition: good  Hospital Course: Unremarkable recovery following Anterior Cervical Decompression and Fusion C 34, C 45, C 56 levels  Consults: None  Significant Diagnostic Studies: None  Treatments: surgery: Anterior Cervical Decompression and Fusion C 34, C 45, C 56   Discharge Exam: Blood pressure 164/99, pulse 62, temperature 98.3 F (36.8 C), temperature source Oral, resp. rate 18, weight 92.08 kg (203 lb), SpO2 97.00%. Neurologic: Alert and oriented X 3, normal strength and tone. Normal symmetric reflexes. Normal coordination and gait Wound:CDI  Disposition: Home     Medication List         methocarbamol 500 MG tablet  Commonly known as:  ROBAXIN  Take 1 tablet (500 mg total) by mouth every 8 (eight) hours as needed for muscle spasms.     oxycodone 5 MG capsule  Commonly known as:  OXY-IR  Take 5 mg by mouth every 4 (four) hours as needed for pain.     oxyCODONE 5 MG immediate release tablet  Commonly known as:  Oxy IR/ROXICODONE  Take 1-2 tablets (5-10 mg total) by mouth every 3 (three) hours as needed for moderate pain or severe pain.     sildenafil 100 MG tablet  Commonly known as:  VIAGRA  Take 100 mg by mouth daily as needed for erectile dysfunction.         Signed: Erline Levine, MD 11/25/2013, 8:37 AM

## 2013-11-25 NOTE — Evaluation (Signed)
Occupational Therapy Evaluation Patient Details Name: Kurt Avila MRN: [id number] DOB: May 14, 1956 Today's Date: 11/25/2013    History of Present Illness 58 yo male s/p ACDF C3-6   Clinical Impression   Patient evaluated by Occupational Therapy with no further acute OT needs identified. All education has been completed and the patient has no further questions. See below for any follow-up Occupational Therapy or equipment needs. OT to sign off. Thank you for referral.      Follow Up Recommendations  No OT follow up    Equipment Recommendations  None recommended by OT    Recommendations for Other Services       Precautions / Restrictions Precautions Precautions: Cervical Required Braces or Orthoses: Cervical Brace Cervical Brace: At all times;Soft collar      Mobility Bed Mobility Overal bed mobility: Needs Assistance Bed Mobility: Sidelying to Sit;Sit to Sidelying   Sidelying to sit: Supervision     Sit to sidelying: Supervision General bed mobility comments: cueing for sequence with pt able to return demonstrate  Transfers Overall transfer level: Modified independent                    Balance Overall balance assessment: No apparent balance deficits (not formally assessed)                                          ADL Overall ADL's : Modified independent                                       General ADL Comments: Educated and demonstrated don /doff cervical brace, oral care and LB dressing. Wife present and handout reviewed     Vision                     Perception     Praxis      Pertinent Vitals/Pain VSS No pain reported      Hand Dominance Right   Extremity/Trunk Assessment Upper Extremity Assessment Upper Extremity Assessment: Overall WFL for tasks assessed LUE Deficits / Details: tingling in Lt UE, reports pain very improved   Lower Extremity Assessment Lower Extremity Assessment:  Defer to PT evaluation   Cervical / Trunk Assessment Cervical / Trunk Assessment: Other exceptions (s/p surg)   Communication Communication Communication: No difficulties   Cognition Arousal/Alertness: Awake/alert Behavior During Therapy: WFL for tasks assessed/performed Overall Cognitive Status: Within Functional Limits for tasks assessed                     General Comments       Exercises       Shoulder Instructions      Home Living Family/patient expects to be discharged to:: Private residence Living Arrangements: Spouse/significant other Available Help at Discharge: Family Type of Home: House Home Access: Stairs to enter Technical brewer of Steps: 5 Entrance Stairs-Rails: Right Home Layout: One level     Bathroom Shower/Tub: Walk-in shower;Door   [employer] Toilet: Standard     Home Equipment: None          Prior Functioning/Environment Level of Independence: Independent             OT Diagnosis:     OT Problem List:     OT Treatment/Interventions:  OT Goals(Current goals can be found in the care plan section)    OT Frequency:     Barriers to D/C:            Co-evaluation              End of Session Nurse Communication: Mobility status;Precautions  Activity Tolerance: Patient tolerated treatment well Patient left: in chair;with call bell/phone within reach;with family/visitor present   Time: 7858-8502 OT Time Calculation (min): 23 min Charges:  OT General Charges $OT Visit: 1 Procedure OT Evaluation $Initial OT Evaluation Tier I: 1 Procedure OT Treatments $Self Care/Home Management : 8-22 mins G-Codes: OT G-codes **NOT FOR INPATIENT CLASS** Functional Assessment Tool Used: clinical judgement Functional Limitation: Self care Self Care Current Status (D7412): At least 1 percent but less than 20 percent impaired, limited or restricted Self Care Goal Status (I7867): At least 1 percent but less than 20 percent  impaired, limited or restricted Self Care Discharge Status [phone]): At least 1 percent but less than 20 percent impaired, limited or restricted  Peri Maris 11/25/2013, 8:51 AM Pager: [phone]

## 2013-11-25 NOTE — Progress Notes (Signed)
Pt. Alert and oriented, follows simple instructions, denies pain. Incision area without swelling, redness or S/S of infection. Voiding adequate clear yellow urine. Moving all extremities well and vitals stable and documented. Patient discharged home with family.  Anterior Cervical Fusion surgery notes instructions given to patient and family member for home safety and precautions. Pt. and family stated understanding of instructions given. Pain meds given per Pt.'s request for pain and discomfort of ride home. 

## 2014-05-25 ENCOUNTER — Other Ambulatory Visit (HOSPITAL_COMMUNITY): Payer: Self-pay | Admitting: Cardiovascular Disease

## 2014-05-25 ENCOUNTER — Encounter: Payer: Self-pay | Admitting: Cardiovascular Disease

## 2014-05-25 ENCOUNTER — Ambulatory Visit (HOSPITAL_COMMUNITY)
Admission: RE | Admit: 2014-05-25 | Discharge: 2014-05-25 | Disposition: A | Discharge status: Home / Self Care | Payer: 59 | Source: Ambulatory Visit | Attending: Cardiovascular Disease | Admitting: Cardiovascular Disease

## 2014-05-25 ENCOUNTER — Ambulatory Visit (INDEPENDENT_AMBULATORY_CARE_PROVIDER_SITE_OTHER): Payer: 59 | Admitting: Cardiovascular Disease

## 2014-05-25 VITALS — BP 160/91 | HR 60 | Resp 16 | Ht 69.0 in | Wt 205.3 lb

## 2014-05-25 DIAGNOSIS — Z01810 Encounter for preprocedural cardiovascular examination: Secondary | ICD-10-CM

## 2014-05-25 DIAGNOSIS — R9431 Abnormal electrocardiogram [ECG] [EKG]: Secondary | ICD-10-CM | POA: Insufficient documentation

## 2014-05-25 DIAGNOSIS — I4510 Unspecified right bundle-branch block: Secondary | ICD-10-CM | POA: Insufficient documentation

## 2014-05-25 DIAGNOSIS — I10 Essential (primary) hypertension: Secondary | ICD-10-CM | POA: Insufficient documentation

## 2014-05-25 NOTE — Assessment & Plan Note (Signed)
I think he clearly has systemic hypertension and requires pharmacological therapy, but he wants to try to aggressively restrict the sodium in his diet and exercise more to avoid starting medications. I'm skeptical that he will be able to make sufficient changes in the near term to "fix" his high blood pressure, but am willing to give him a chance. I have told him that I'm a little worried that the apprehension of surgery may lead to very high blood pressures when he presents for his prostate procedure and that this might be to delay in surgical intervention. He would benefit a lot from losing weight and reducing his alcohol consumption.

## 2014-05-25 NOTE — Patient Instructions (Signed)
Your physician has requested that you have an exercise tolerance test TODAY. For further information please visit [email or web address]. Please also follow instruction sheet, as given.  Dr. Sallyanne Kuster recommends that you schedule a follow-up appointment in: One year.

## 2014-05-25 NOTE — Assessment & Plan Note (Signed)
Kurt Avila does not have any symptoms of active cardiac illness and has minimal abnormalities on his ECG and a normal treadmill stress test. His risk of major cardiovascular complications with prostate surgery is low.

## 2014-05-25 NOTE — Procedures (Signed)
Exercise Treadmill Test  Pre-Exercise Testing Evaluation  NSR, incomplete RBBB, normal repolarization pattern  Test  Exercise Tolerance Test Ordering MD: M. Forever Arechiga MD  Interpreting MD:   Unique Test No: 1  Treadmill:  1  Indication for ETT: Pre-Op Clearance  Contraindication to ETT: No   Stress Modality: exercise - treadmill  Cardiac Imaging Performed: non   Protocol: standard Bruce - maximal  Max BP:  214/93  Max MPHR (bpm):  163 85% MPR (bpm):  138  MPHR obtained (bpm):  155 % MPHR obtained:  95  Reached 85% MPHR (min:sec):  5:25 Total Exercise Time (min-sec): 6:22  Workload in METS:  7.50 Borg Scale:   Reason ETT Terminated:  fatigue, shortness of breath    ST Segment Analysis At Rest: normal ST segments - no evidence of significant ST depression With Exercise: no evidence of significant ST depression  Other Information Arrhythmia:  No Angina during ETT:  absent (0) Quality of ETT:  diagnostic  ETT Interpretation:  normal - no evidence of ischemia by ST analysis  Comments: Fair exercise tolerance Hypertension at baseline and hypertensive response to exercise  Recommendations: Treatment for HTN. Low risk for cardiac complications with planned prostate surgery.
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
## 2014-05-26 ENCOUNTER — Other Ambulatory Visit: Payer: Self-pay | Admitting: Urology

## 2014-06-27 ENCOUNTER — Ambulatory Visit (HOSPITAL_COMMUNITY)
Admission: RE | Admit: 2014-06-27 | Discharge: 2014-06-27 | Disposition: A | Discharge status: Home / Self Care | Payer: 59 | Source: Ambulatory Visit | Attending: Urology | Admitting: Urology

## 2014-06-27 ENCOUNTER — Encounter (HOSPITAL_COMMUNITY)
Admission: RE | Admit: 2014-06-27 | Discharge: 2014-06-27 | Disposition: A | Discharge status: Home / Self Care | Payer: 59 | Source: Ambulatory Visit | Attending: Urology | Admitting: Urology

## 2014-06-27 ENCOUNTER — Encounter (HOSPITAL_COMMUNITY): Payer: Self-pay

## 2014-06-27 DIAGNOSIS — Z01818 Encounter for other preprocedural examination: Secondary | ICD-10-CM | POA: Diagnosis present

## 2014-06-27 DIAGNOSIS — C61 Malignant neoplasm of prostate: Secondary | ICD-10-CM

## 2014-06-27 NOTE — Patient Instructions (Signed)
20     Your procedure is scheduled on:  Monday 07/04/2014  Report to [facility] Main Entrance and follow signs to Short Stay  at   [place] AM.  Call this number if you have problems the morning of surgery: [phone]                    Remember: [place] DR. BORDEN'S OFFICE ON Sunday 07/03/2014!          Do not eat food after Saturday 07/02/2014 or drink liquids AFTER MIDNIGHT on 07/03/2014!!  Take these medicines the morning of surgery with A SIP OF WATER: NONE    [facility] IS NOT RESPONSIBLE FOR ANY BELONGINGS OR VALUABLES BROUGHT TO HOSPITAL.  Marland Kitchen  Leave suitcase in the car. After surgery it may be brought to your room.     DO NOT WEAR JEWELRY,MAKE-UP,LOTIONS,POWDERS,PERFUMES,CONTACTS , DENTURES, BRIDGEWORK AND DO NOT WEAR FALSE EYELASHES  !                                  Patients discharged the day of surgery will not be allowed to drive home.  If going home the same day of surgery, must have someone stay with you  first 24 hrs.at home and arrange for someone to drive you home from the [place]: N/A   Special Instructions:              Please read over the following fact sheets that you were given:             1. [place] - Preparing for Surgery Before surgery, you can play an important role.  Because skin is not sterile, your skin needs to be as free of germs as possible.  You can reduce the number of germs on your skin by washing with CHG (chlorahexidine gluconate) soap before surgery.  CHG is an antiseptic cleaner which kills germs and bonds with the skin to continue killing germs even after washing. Please DO NOT use if you have an allergy to CHG or antibacterial soaps.  If your skin becomes reddened/irritated stop using the CHG and inform your nurse when you arrive  at Short Stay. Do not shave (including legs and underarms) for at least 48 hours prior to the first CHG shower.  You may shave your face/neck. Please follow these instructions carefully:  1.  Shower with CHG Soap the night before surgery and the  morning of Surgery.  2.  If you choose to wash your hair, wash your hair first as usual with your  normal  shampoo.  3.  After you shampoo, rinse your hair and body thoroughly to remove the  shampoo.  4.  Use CHG as you would any other liquid soap.  You can apply chg directly  to the skin and wash                       Gently with a scrungie or clean washcloth.  5.  Apply the CHG Soap to your body ONLY FROM THE NECK DOWN.   Do not use on face/ open                           Wound or open sores. Avoid contact with eyes, ears mouth and genitals (private parts).                       Wash face,  Genitals (private parts) with your normal soap.             6.  Wash thoroughly, paying special attention to the area where your surgery  will be performed.  7.  Thoroughly rinse your body with warm water from the neck down.  8.  DO NOT shower/wash with your normal soap after using and rinsing off  the CHG Soap.                9.  Pat yourself dry with a clean towel.            10.  Wear clean pajamas.            11.  Place clean sheets on your bed the night of your first shower and do not  sleep with pets. Day of Surgery : Do not apply any lotions/deodorants the morning of surgery.  Please wear clean clothes to the hospital/surgery center.  FAILURE TO FOLLOW THESE INSTRUCTIONS MAY RESULT IN THE CANCELLATION OF YOUR SURGERY PATIENT SIGNATURE_________________________________  NURSE SIGNATURE__________________________________  ________________________________________________________________________   Kurt Avila  An incentive spirometer is a tool that can help keep your lungs clear and active. This tool measures how well you  are filling your lungs with each breath. Taking long deep breaths may help reverse or decrease the chance of developing breathing (pulmonary) problems (especially infection) following:  A long period of time when you are unable to move or be active. BEFORE THE PROCEDURE   If the spirometer includes an indicator to show your best effort, your nurse or respiratory therapist will set it to a desired goal.  If possible, sit up straight or lean slightly forward. Try not to slouch.  Hold the incentive spirometer in an upright position. INSTRUCTIONS FOR USE   Sit on the edge of your bed if possible, or sit up as far as you can in bed or on a chair.  Hold the incentive spirometer in an upright position.  Breathe out normally.  Place the mouthpiece in your mouth and seal your lips tightly around it.  Breathe in slowly and as deeply as possible, raising the piston or the [place] toward the top of the column.  Hold your breath for 3-5 seconds or for as long as possible. Allow the piston or [place] to fall to the bottom of the column.  Remove the mouthpiece from your mouth and breathe out normally.  Rest for a few seconds and repeat Steps 1 through 7 at least 10 times every 1-2 hours when you are awake. Take your time and take a few normal breaths between deep breaths.  The spirometer may include an indicator to show  your best effort. Use the indicator as a goal to work toward during each repetition.  After each set of 10 deep breaths, practice coughing to be sure your lungs are clear. If you have an incision (the cut made at the time of surgery), support your incision when coughing by placing a pillow or rolled up towels firmly against it. Once you are able to get out of bed, walk around indoors and cough well. You may stop using the incentive spirometer when instructed by your caregiver.  RISKS AND COMPLICATIONS  Take your time so you do not get dizzy or light-headed.  If you are in pain, you may  need to take or ask for pain medication before doing incentive spirometry. It is harder to take a deep breath if you are having pain. AFTER USE  Rest and breathe slowly and easily.  It can be helpful to keep track of a log of your progress. Your caregiver can provide you with a simple table to help with this. If you are using the spirometer at home, follow these instructions: [place] IF:   You are having difficultly using the spirometer.  You have trouble using the spirometer as often as instructed.  Your pain medication is not giving enough relief while using the spirometer.  You develop fever of 100.5 F (38.1 C) or higher. SEEK IMMEDIATE MEDICAL CARE IF:   You cough up bloody sputum that had not been present before.  You develop fever of 102 F (38.9 C) or greater.  You develop worsening pain at or near the incision site. MAKE SURE YOU:   Understand these instructions.  Will watch your condition.  Will get help right away if you are not doing well or get worse. Document Released: 11/18/2006 Document Revised: 09/30/2011 Document Reviewed: 01/19/2007 ExitCare Patient Information 2014 ExitCare, [place].   ________________________________________________________________________  WHAT IS A BLOOD TRANSFUSION? Blood Transfusion Information  A transfusion is the replacement of blood or some of its parts. Blood is made up of multiple cells which provide different functions.  Red blood cells carry oxygen and are used for blood loss replacement.  White blood cells fight against infection.  Platelets control bleeding.  Plasma helps clot blood.  Other blood products are available for specialized needs, such as hemophilia or other clotting disorders. BEFORE THE TRANSFUSION  Who gives blood for transfusions?   Healthy volunteers who are fully evaluated to make sure their blood is safe. This is blood bank blood. Transfusion therapy is the safest it has ever been in  the practice of medicine. Before blood is taken from a donor, a complete history is taken to make sure that person has no history of diseases nor engages in risky social behavior (examples are intravenous drug use or sexual activity with multiple partners). The donor's travel history is screened to minimize risk of transmitting infections, such as malaria. The donated blood is tested for signs of infectious diseases, such as HIV and hepatitis. The blood is then tested to be sure it is compatible with you in order to minimize the chance of a transfusion reaction. If you or a relative donates blood, this is often done in anticipation of surgery and is not appropriate for emergency situations. It takes many days to process the donated blood. RISKS AND COMPLICATIONS Although transfusion therapy is very safe and saves many lives, the main dangers of transfusion include:   Getting an infectious disease.  Developing a transfusion reaction. This is an allergic reaction to  something in the blood you were given. Every precaution is taken to prevent this. The decision to have a blood transfusion has been considered carefully by your caregiver before blood is given. Blood is not given unless the benefits outweigh the risks. AFTER THE TRANSFUSION  Right after receiving a blood transfusion, you will usually feel much better and more energetic. This is especially true if your red blood cells have gotten low (anemic). The transfusion raises the level of the red blood cells which carry oxygen, and this usually causes an energy increase.  The nurse administering the transfusion will monitor you carefully for complications. HOME CARE INSTRUCTIONS  No special instructions are needed after a transfusion. You may find your energy is better. Speak with your caregiver about any limitations on activity for underlying diseases you may have. SEEK MEDICAL CARE IF:   Your condition is not improving after your  transfusion.  You develop redness or irritation at the intravenous (IV) site. SEEK IMMEDIATE MEDICAL CARE IF:  Any of the following symptoms occur over the next 12 hours:  Shaking chills.  You have a temperature by mouth above 102 F (38.9 C), not controlled by medicine.  Chest, back, or muscle pain.  People around you feel you are not acting correctly or are confused.  Shortness of breath or difficulty breathing.  Dizziness and fainting.  You get a rash or develop hives.  You have a decrease in urine output.  Your urine turns a dark color or changes to pink, red, or brown. Any of the following symptoms occur over the next 10 days:  You have a temperature by mouth above 102 F (38.9 C), not controlled by medicine.  Shortness of breath.  Weakness after normal activity.  The white part of the eye turns yellow (jaundice).  You have a decrease in the amount of urine or are urinating less often.  Your urine turns a dark color or changes to pink, red, or brown. Document Released: 07/05/2000 Document Revised: 09/30/2011 Document Reviewed: 02/22/2008 ExitCare Patient Information 2014 ExitCare, [place].  _______________________________________________________________________   CLEAR LIQUID DIET   Foods Allowed                                                                     Foods Excluded  Coffee and tea, regular and decaf                             liquids that you cannot  Plain Jell-O in any flavor                                             see through such as: Fruit ices (not with fruit pulp)                                     milk, soups, orange juice  Iced Popsicles  All solid food Carbonated beverages, regular and diet                                    Cranberry, grape and apple juices Sports drinks like Gatorade Lightly seasoned clear broth or consume(fat free) Sugar, honey syrup  Sample Menu Breakfast                                 Lunch                                     Supper Cranberry juice                    Beef broth                            Chicken broth Jell-O                                     Grape juice                           Apple juice Coffee or tea                        Jell-O                                      Popsicle                                                Coffee or tea                        Coffee or tea  _____________________________________________________________________

## 2014-06-27 NOTE — Progress Notes (Signed)
   06/27/14 0841  OBSTRUCTIVE SLEEP APNEA  Have you ever been diagnosed with sleep apnea through a sleep study? No  Do you snore loudly (loud enough to be heard through closed doors)?  1  Do you often feel tired, fatigued, or sleepy during the daytime? 0  Has anyone observed you stop breathing during your sleep? 0  Do you have, or are you being treated for high blood pressure? 0  BMI more than 35 kg/m2? 0  Age over 57 years old? 1  Neck circumference greater than 40 cm/16 inches? 1  Gender: 1  Obstructive Sleep Apnea Score 4  Score 4 or greater  Results sent to PCP

## 2014-06-27 NOTE — Progress Notes (Signed)
06/10/2014- Labs from Dr. Alyson Ingles on chart- CBC w/diff., CMET,Lipid panel, PSA, TSH, Urine routine

## 2014-07-01 NOTE — H&P (Signed)
Chief Complaint Prostate Cancer     History of Present Illness Kurt Avila is a 58 year old gentleman who was found to have a persistently elevated PSA of 7.6 and prostate nodule at the apex of the prostate. This prompted a prostate needle biopsy by Dr. Junious Silk on 04/21/14 which demonstrated Gleason 3+3=6 adenocarcinoma of the prostate with 4 out of 12 biopsy cores positive. He has a family history of prostate cancer with his brother having been diagnosed at age 52 and having undergone surgical therapy. He has undergone a cardiac risk assessment and is felt to be at low risk.  He has a strong family history of coronary artery disease with his brother having had a heart attack at age 14.   TNM stage: cT2a Nx Mx PSA: 7.6 Gleason score: 3+3=6 Biopsy (04/21/14): 4/12 cores positive   Left: L lateral apex (70%), L apex (5%), L lateral mid (5%)   Right: R lateral apex (20%, 3+3=6) Prostate volume: 36.2 cc  Nomogram OC disease: 51% EPE: 49% SVI: 2% LNI: 2% PFS (surgery): 90% at 6 years, 83% at 11 years  Urinary function: He has minimal lower urinary tract symptoms. IPSS is 5. Erectile function: He has erectile dysfunction and has only partially responsive to PDE-5 inhibitors. He has severe erectile dysfunction. SHIM score is 1.   Past Medical History Problems  1. History of No Medical Problems  Surgical History Problems  1. History of Facial Surgery 2. History of Neck Surgery  Current Meds 1. Levofloxacin 500 MG Oral Tablet; Take 1 tablet po the day before procedure, 1 tab  day of  the procedure and 1 tab the day after procedure;  Therapy: 01Sep2015 to (Last Rx:01Sep2015)  Requested for: 02Sep2015 Ordered 2. Prostaglandin E1 10ug/mL; Test dose 1 ml;  Therapy: 28Oct2015 to (Last Rx:28Oct2015) Ordered 3. Viagra TABS;  Therapy: (Recorded:01Sep2015) to Recorded  Allergies Medication  1. Penicillins  Family History Problems  1. Family history of Colon cancer : Maternal Aunt 2.  Family history of Death In The Family Father : Father   33- complications from sgy 3. Family history of Death In The Family Mother : Mother   46- suicide- gunshot wound 4. Family history of Diabetes Mellitus : Father 5. Family history of Prostate Cancer : Brother 6. Family history of Rheumatic Heart Disease : Mother  Social History Problems    Alcohol consumption of more than four drinks per day (Z72.89)   Alcohol Use   6-12 per day   Marital History - Currently Married   Never A Smoker   Occupation:   Technical sales engineer  Review of Systems Constitutional, skin, eye, otolaryngeal, hematologic/lymphatic, cardiovascular, pulmonary, endocrine, musculoskeletal, gastrointestinal, neurological and psychiatric system(s) were reviewed and pertinent findings if present are noted.    Vitals Vital Signs [Data Includes: Last 1 Day]  Recorded: [id number] 08:56AM  Blood Pressure: 181 / 95 Heart Rate: 75  Physical Exam Constitutional: Well nourished and well developed . No acute distress.  ENT:. The ears and nose are normal in appearance.  Neck: The appearance of the neck is normal and no neck mass is present.  Pulmonary: No respiratory distress, normal respiratory rhythm and effort and clear bilateral breath sounds.  Cardiovascular: Heart rate and rhythm are normal . No peripheral edema.  Abdomen: The abdomen is soft and nontender. No masses are palpated. No CVA tenderness. No hernias are palpable. No hepatosplenomegaly noted.    Assessment Assessed  1. Prostate cancer (C61)    Discussion/Summary 1. Prostate cancer:  He will undergo a bilateral nerve sparing robotic-assisted laparoscopic radical prostatectomy.

## 2014-07-04 ENCOUNTER — Inpatient Hospital Stay (HOSPITAL_COMMUNITY)
Admission: RE | Admit: 2014-07-04 | Discharge: 2014-07-05 | DRG: 708 | Disposition: A | Discharge status: Home / Self Care | Payer: 59 | Source: Ambulatory Visit | Attending: Urology | Admitting: Urology

## 2014-07-04 ENCOUNTER — Encounter (HOSPITAL_COMMUNITY)
Admission: RE | Disposition: A | Discharge status: Home / Self Care | Payer: Self-pay | Source: Ambulatory Visit | Attending: Urology

## 2014-07-04 ENCOUNTER — Inpatient Hospital Stay (HOSPITAL_COMMUNITY): Payer: 59 | Admitting: Anesthesiology

## 2014-07-04 ENCOUNTER — Encounter (HOSPITAL_COMMUNITY): Payer: Self-pay | Admitting: *Deleted

## 2014-07-04 DIAGNOSIS — C61 Malignant neoplasm of prostate: Secondary | ICD-10-CM | POA: Diagnosis present

## 2014-07-04 DIAGNOSIS — Z880 Allergy status to penicillin: Secondary | ICD-10-CM

## 2014-07-04 HISTORY — PX: ROBOT ASSISTED LAPAROSCOPIC RADICAL PROSTATECTOMY: SHX5141

## 2014-07-04 LAB — TYPE AND SCREEN
ABO/RH(D): A POS
ANTIBODY SCREEN: NEGATIVE

## 2014-07-04 LAB — HEMOGLOBIN AND HEMATOCRIT, BLOOD
HCT: 39.8 % (ref 39.0–52.0)
HEMOGLOBIN: 13.6 g/dL (ref 13.0–17.0)

## 2014-07-04 LAB — ABO/RH: ABO/RH(D): A POS

## 2014-07-04 SURGERY — ROBOTIC ASSISTED LAPAROSCOPIC RADICAL PROSTATECTOMY LEVEL 1
Anesthesia: General

## 2014-07-04 MED ORDER — DOCUSATE SODIUM 100 MG PO CAPS
100.0000 mg | ORAL_CAPSULE | Freq: Two times a day (BID) | ORAL | Status: DC
  Administered 2014-07-04 – 2014-07-05 (×2): 100 mg via ORAL
  Filled 2014-07-04 (×2): qty 1

## 2014-07-04 MED ORDER — MIDAZOLAM HCL 5 MG/5ML IJ SOLN
INTRAMUSCULAR | Status: DC | PRN
  Administered 2014-07-04: 2 mg via INTRAVENOUS

## 2014-07-04 MED ORDER — SUCCINYLCHOLINE CHLORIDE 20 MG/ML IJ SOLN
INTRAMUSCULAR | Status: DC | PRN
  Administered 2014-07-04: 100 mg via INTRAVENOUS

## 2014-07-04 MED ORDER — METOPROLOL TARTRATE 25 MG PO TABS: 12.5000 mg | ORAL_TABLET | Freq: Two times a day (BID) | ORAL | Status: DC

## 2014-07-04 MED ORDER — HYDROMORPHONE HCL 1 MG/ML IJ SOLN
0.2500 mg | INTRAMUSCULAR | Status: DC | PRN
  Administered 2014-07-04 (×4): 0.5 mg via INTRAVENOUS

## 2014-07-04 MED ORDER — LACTATED RINGERS IV SOLN
INTRAVENOUS | Status: DC | PRN
  Administered 2014-07-04 (×2): via INTRAVENOUS

## 2014-07-04 MED ORDER — CISATRACURIUM BESYLATE 20 MG/10ML IV SOLN
INTRAVENOUS | Status: AC
  Filled 2014-07-04: qty 10

## 2014-07-04 MED ORDER — MEPERIDINE HCL 50 MG/ML IJ SOLN: 6.2500 mg | INTRAMUSCULAR | Status: DC | PRN

## 2014-07-04 MED ORDER — ACETAMINOPHEN 325 MG PO TABS: 650.0000 mg | ORAL_TABLET | ORAL | Status: DC | PRN

## 2014-07-04 MED ORDER — SODIUM CHLORIDE 0.9 % IJ SOLN
INTRAMUSCULAR | Status: AC
  Filled 2014-07-04: qty 10

## 2014-07-04 MED ORDER — DEXAMETHASONE SODIUM PHOSPHATE 10 MG/ML IJ SOLN
INTRAMUSCULAR | Status: AC
  Filled 2014-07-04: qty 1

## 2014-07-04 MED ORDER — HYDROMORPHONE HCL 1 MG/ML IJ SOLN
INTRAMUSCULAR | Status: DC | PRN
  Administered 2014-07-04 (×5): .4 mg via INTRAVENOUS

## 2014-07-04 MED ORDER — HYDROMORPHONE HCL 1 MG/ML IJ SOLN
INTRAMUSCULAR | Status: AC
  Filled 2014-07-04: qty 1

## 2014-07-04 MED ORDER — KCL IN DEXTROSE-NACL 20-5-0.45 MEQ/L-%-% IV SOLN
INTRAVENOUS | Status: DC
  Administered 2014-07-04 – 2014-07-05 (×3): via INTRAVENOUS
  Filled 2014-07-04 (×4): qty 1000

## 2014-07-04 MED ORDER — MORPHINE SULFATE 2 MG/ML IJ SOLN
2.0000 mg | INTRAMUSCULAR | Status: DC | PRN
  Administered 2014-07-04 – 2014-07-05 (×4): 2 mg via INTRAVENOUS
  Filled 2014-07-04 (×4): qty 1

## 2014-07-04 MED ORDER — LIDOCAINE HCL (CARDIAC) 20 MG/ML IV SOLN
INTRAVENOUS | Status: AC
  Filled 2014-07-04: qty 5

## 2014-07-04 MED ORDER — BUPIVACAINE-EPINEPHRINE (PF) 0.25% -1:200000 IJ SOLN
INTRAMUSCULAR | Status: AC
  Filled 2014-07-04: qty 30

## 2014-07-04 MED ORDER — HEPARIN SODIUM (PORCINE) 1000 UNIT/ML IJ SOLN
INTRAMUSCULAR | Status: AC
  Filled 2014-07-04: qty 1

## 2014-07-04 MED ORDER — LABETALOL HCL 5 MG/ML IV SOLN
INTRAVENOUS | Status: AC
  Filled 2014-07-04: qty 4

## 2014-07-04 MED ORDER — SODIUM CHLORIDE 0.9 % IR SOLN
Status: DC | PRN
  Administered 2014-07-04: 1000 mL via INTRAVESICAL

## 2014-07-04 MED ORDER — SUFENTANIL CITRATE 50 MCG/ML IV SOLN
INTRAVENOUS | Status: DC | PRN
  Administered 2014-07-04 (×3): 10 ug via INTRAVENOUS
  Administered 2014-07-04: 20 ug via INTRAVENOUS

## 2014-07-04 MED ORDER — LACTATED RINGERS IV SOLN: INTRAVENOUS | Status: DC

## 2014-07-04 MED ORDER — PROPOFOL 10 MG/ML IV BOLUS
INTRAVENOUS | Status: AC
  Filled 2014-07-04: qty 20

## 2014-07-04 MED ORDER — LORAZEPAM 0.5 MG PO TABS
0.5000 mg | ORAL_TABLET | Freq: Two times a day (BID) | ORAL | Status: DC
  Administered 2014-07-04 – 2014-07-05 (×3): 0.5 mg via ORAL
  Filled 2014-07-04 (×3): qty 1

## 2014-07-04 MED ORDER — DIPHENHYDRAMINE HCL 50 MG/ML IJ SOLN: 12.5000 mg | Freq: Four times a day (QID) | INTRAMUSCULAR | Status: DC | PRN

## 2014-07-04 MED ORDER — EPHEDRINE SULFATE 50 MG/ML IJ SOLN
INTRAMUSCULAR | Status: AC
  Filled 2014-07-04: qty 1

## 2014-07-04 MED ORDER — SODIUM CHLORIDE 0.9 % IV BOLUS (SEPSIS)
1000.0000 mL | Freq: Once | INTRAVENOUS | Status: AC
  Administered 2014-07-04: 1000 mL via INTRAVENOUS

## 2014-07-04 MED ORDER — GLYCOPYRROLATE 0.2 MG/ML IJ SOLN
INTRAMUSCULAR | Status: AC
  Filled 2014-07-04: qty 3

## 2014-07-04 MED ORDER — PROPOFOL 10 MG/ML IV BOLUS
INTRAVENOUS | Status: DC | PRN
  Administered 2014-07-04: 30 mg via INTRAVENOUS
  Administered 2014-07-04: 170 mg via INTRAVENOUS

## 2014-07-04 MED ORDER — METOPROLOL TARTRATE 25 MG PO TABS
12.5000 mg | ORAL_TABLET | Freq: Two times a day (BID) | ORAL | Status: DC
  Administered 2014-07-04 – 2014-07-05 (×3): 12.5 mg via ORAL
  Filled 2014-07-04 (×3): qty 1

## 2014-07-04 MED ORDER — KETOROLAC TROMETHAMINE 15 MG/ML IJ SOLN
15.0000 mg | Freq: Four times a day (QID) | INTRAMUSCULAR | Status: DC
  Administered 2014-07-04 – 2014-07-05 (×4): 15 mg via INTRAVENOUS
  Filled 2014-07-04 (×5): qty 1

## 2014-07-04 MED ORDER — HYDROMORPHONE HCL 2 MG/ML IJ SOLN
INTRAMUSCULAR | Status: AC
  Filled 2014-07-04: qty 1

## 2014-07-04 MED ORDER — VANCOMYCIN HCL IN DEXTROSE 1-5 GM/200ML-% IV SOLN
INTRAVENOUS | Status: AC
  Filled 2014-07-04: qty 200

## 2014-07-04 MED ORDER — LIDOCAINE HCL (CARDIAC) 20 MG/ML IV SOLN
INTRAVENOUS | Status: DC | PRN
  Administered 2014-07-04: 100 mg via INTRAVENOUS

## 2014-07-04 MED ORDER — PROMETHAZINE HCL 25 MG/ML IJ SOLN: 6.2500 mg | INTRAMUSCULAR | Status: DC | PRN

## 2014-07-04 MED ORDER — CIPROFLOXACIN HCL 500 MG PO TABS: 500.0000 mg | ORAL_TABLET | Freq: Two times a day (BID) | ORAL | Status: DC

## 2014-07-04 MED ORDER — KCL IN DEXTROSE-NACL 20-5-0.45 MEQ/L-%-% IV SOLN
INTRAVENOUS | Status: AC
  Filled 2014-07-04: qty 1000

## 2014-07-04 MED ORDER — DIPHENHYDRAMINE HCL 12.5 MG/5ML PO ELIX: 12.5000 mg | ORAL_SOLUTION | Freq: Four times a day (QID) | ORAL | Status: DC | PRN

## 2014-07-04 MED ORDER — OXYCODONE HCL 5 MG PO CAPS: 5.0000 mg | ORAL_CAPSULE | ORAL | Status: DC | PRN

## 2014-07-04 MED ORDER — ONDANSETRON HCL 4 MG/2ML IJ SOLN
INTRAMUSCULAR | Status: AC
  Filled 2014-07-04: qty 2

## 2014-07-04 MED ORDER — VANCOMYCIN HCL IN DEXTROSE 1-5 GM/200ML-% IV SOLN
1000.0000 mg | Freq: Once | INTRAVENOUS | Status: AC
  Administered 2014-07-04: 1000 mg via INTRAVENOUS

## 2014-07-04 MED ORDER — STERILE WATER FOR IRRIGATION IR SOLN
Status: DC | PRN
  Administered 2014-07-04: 3000 mL

## 2014-07-04 MED ORDER — LACTATED RINGERS IV SOLN
INTRAVENOUS | Status: DC | PRN
  Administered 2014-07-04: 08:00:00

## 2014-07-04 MED ORDER — NEOSTIGMINE METHYLSULFATE 10 MG/10ML IV SOLN
INTRAVENOUS | Status: DC | PRN
  Administered 2014-07-04: 4 mg via INTRAVENOUS

## 2014-07-04 MED ORDER — DEXAMETHASONE SODIUM PHOSPHATE 10 MG/ML IJ SOLN
INTRAMUSCULAR | Status: DC | PRN
  Administered 2014-07-04: 10 mg via INTRAVENOUS

## 2014-07-04 MED ORDER — CISATRACURIUM BESYLATE (PF) 10 MG/5ML IV SOLN
INTRAVENOUS | Status: DC | PRN
  Administered 2014-07-04: 12 mg via INTRAVENOUS
  Administered 2014-07-04: 6 mg via INTRAVENOUS
  Administered 2014-07-04: 2 mg via INTRAVENOUS

## 2014-07-04 MED ORDER — BUPIVACAINE-EPINEPHRINE 0.25% -1:200000 IJ SOLN
INTRAMUSCULAR | Status: DC | PRN
  Administered 2014-07-04: 30 mL

## 2014-07-04 MED ORDER — MIDAZOLAM HCL 2 MG/2ML IJ SOLN
INTRAMUSCULAR | Status: AC
  Filled 2014-07-04: qty 2

## 2014-07-04 MED ORDER — GLYCOPYRROLATE 0.2 MG/ML IJ SOLN
INTRAMUSCULAR | Status: DC | PRN
  Administered 2014-07-04: .6 mg via INTRAVENOUS

## 2014-07-04 MED ORDER — ONDANSETRON HCL 4 MG/2ML IJ SOLN
INTRAMUSCULAR | Status: DC | PRN
  Administered 2014-07-04: 4 mg via INTRAVENOUS

## 2014-07-04 MED ORDER — LABETALOL HCL 5 MG/ML IV SOLN
INTRAVENOUS | Status: DC | PRN
  Administered 2014-07-04 (×3): 5 mg via INTRAVENOUS
  Administered 2014-07-04: 10 mg via INTRAVENOUS
  Administered 2014-07-04: 5 mg via INTRAVENOUS
  Administered 2014-07-04: 10 mg via INTRAVENOUS

## 2014-07-04 MED ORDER — VANCOMYCIN HCL IN DEXTROSE 1-5 GM/200ML-% IV SOLN
1000.0000 mg | Freq: Two times a day (BID) | INTRAVENOUS | Status: AC
  Administered 2014-07-04: 1000 mg via INTRAVENOUS
  Filled 2014-07-04: qty 200

## 2014-07-04 MED ORDER — SUFENTANIL CITRATE 50 MCG/ML IV SOLN
INTRAVENOUS | Status: AC
  Filled 2014-07-04: qty 1

## 2014-07-04 SURGICAL SUPPLY — 47 items
CABLE HIGH FREQUENCY MONO STRZ (ELECTRODE) ×2 IMPLANT
CANISTER SUCT 3000ML (MISCELLANEOUS) ×2 IMPLANT
CATH FOLEY 2WAY SLVR 18FR 30CC (CATHETERS) ×2 IMPLANT
CATH ROBINSON RED A/P 16FR (CATHETERS) ×2 IMPLANT
CATH ROBINSON RED A/P 8FR (CATHETERS) ×2 IMPLANT
CATH TIEMANN FOLEY 18FR 5CC (CATHETERS) ×2 IMPLANT
CHLORAPREP W/TINT 26ML (MISCELLANEOUS) ×2 IMPLANT
CLIP LIGATING HEM O LOK PURPLE (MISCELLANEOUS) IMPLANT
CLOTH BEACON ORANGE TIMEOUT ST (SAFETY) ×2 IMPLANT
COVER SURGICAL LIGHT HANDLE (MISCELLANEOUS) ×2 IMPLANT
COVER TIP SHEARS 8 DVNC (MISCELLANEOUS) ×1 IMPLANT
COVER TIP SHEARS 8MM DA VINCI (MISCELLANEOUS) ×1
CUTTER ECHEON FLEX ENDO 45 340 (ENDOMECHANICALS) ×2 IMPLANT
DECANTER SPIKE VIAL GLASS SM (MISCELLANEOUS) IMPLANT
DRAPE SURG IRRIG POUCH 19X23 (DRAPES) ×2 IMPLANT
DRSG TEGADERM 4X4.75 (GAUZE/BANDAGES/DRESSINGS) ×2 IMPLANT
DRSG TEGADERM 6X8 (GAUZE/BANDAGES/DRESSINGS) ×4 IMPLANT
ELECT REM PT RETURN 9FT ADLT (ELECTROSURGICAL) ×2
ELECTRODE REM PT RTRN 9FT ADLT (ELECTROSURGICAL) ×1 IMPLANT
GLOVE BIO SURGEON STRL SZ 6.5 (GLOVE) ×2 IMPLANT
GLOVE BIOGEL M STRL SZ7.5 (GLOVE) ×4 IMPLANT
GOWN STRL REUS W/TWL LRG LVL3 (GOWN DISPOSABLE) ×6 IMPLANT
HOLDER FOLEY CATH W/STRAP (MISCELLANEOUS) ×2 IMPLANT
IV LACTATED RINGERS 1000ML (IV SOLUTION) ×2 IMPLANT
KIT ACCESSORY DA VINCI DISP (KITS) ×1
KIT ACCESSORY DVNC DISP (KITS) ×1 IMPLANT
LIQUID BAND (GAUZE/BANDAGES/DRESSINGS) IMPLANT
NDL SAFETY ECLIPSE 18X1.5 (NEEDLE) ×1 IMPLANT
NEEDLE HYPO 18GX1.5 SHARP (NEEDLE) ×1
PACK ROBOT UROLOGY CUSTOM (CUSTOM PROCEDURE TRAY) ×2 IMPLANT
RELOAD GREEN ECHELON 45 (STAPLE) ×2 IMPLANT
SET TUBE IRRIG SUCTION NO TIP (IRRIGATION / IRRIGATOR) ×2 IMPLANT
SOLUTION ELECTROLUBE (MISCELLANEOUS) ×2 IMPLANT
SUT ETHILON 3 0 PS 1 (SUTURE) ×2 IMPLANT
SUT MNCRL 3 0 RB1 (SUTURE) ×1 IMPLANT
SUT MNCRL 3 0 VIOLET RB1 (SUTURE) ×1 IMPLANT
SUT MNCRL AB 4-0 PS2 18 (SUTURE) ×4 IMPLANT
SUT MONOCRYL 3 0 RB1 (SUTURE) ×2
SUT VIC AB 0 CT1 27 (SUTURE) ×1
SUT VIC AB 0 CT1 27XBRD ANTBC (SUTURE) ×1 IMPLANT
SUT VIC AB 2-0 SH 27 (SUTURE) ×1
SUT VIC AB 2-0 SH 27X BRD (SUTURE) ×1 IMPLANT
SUT VICRYL 0 UR6 27IN ABS (SUTURE) ×4 IMPLANT
SYR 27GX1/2 1ML LL SAFETY (SYRINGE) ×2 IMPLANT
TOWEL OR 17X26 10 PK STRL BLUE (TOWEL DISPOSABLE) ×2 IMPLANT
TOWEL OR NON WOVEN STRL DISP B (DISPOSABLE) ×2 IMPLANT
WATER STERILE IRR 1500ML POUR (IV SOLUTION) ×4 IMPLANT

## 2014-07-04 NOTE — Anesthesia Preprocedure Evaluation (Addendum)
Anesthesia Evaluation  Patient identified by MRN, date of birth, ID band Patient awake    Reviewed: Allergy & Precautions, H&P , NPO status , Patient's Chart, lab work & pertinent test results  Airway Mallampati: II  TM Distance: >3 FB Neck ROM: Full    Dental no notable dental hx.    Pulmonary neg pulmonary ROS,  breath sounds clear to auscultation  Pulmonary exam normal       Cardiovascular hypertension, Pt. on medications Rhythm:Regular Rate:Normal     Neuro/Psych negative neurological ROS  negative psych ROS   GI/Hepatic negative GI ROS, hiatal hernia, (+)     substance abuse  alcohol use,   Endo/Other  negative endocrine ROS  Renal/GU negative Renal ROS  negative genitourinary   Musculoskeletal negative musculoskeletal ROS (+)   Abdominal   Peds negative pediatric ROS (+)  Hematology negative hematology ROS (+)   Anesthesia Other Findings Upper front bridge  Reproductive/Obstetrics negative OB ROS                            Anesthesia Physical  Anesthesia Plan  ASA: III  Anesthesia Plan: General   Post-op Pain Management:    Induction: Intravenous  Airway Management Planned: Oral ETT  Additional Equipment:   Intra-op Plan:   Post-operative Plan: Extubation in OR  Informed Consent: I have reviewed the patients History and Physical, chart, labs and discussed the procedure including the risks, benefits and alternatives for the proposed anesthesia with the patient or authorized representative who has indicated his/her understanding and acceptance.     Plan Discussed with:   Anesthesia Plan Comments:        Anesthesia Quick Evaluation

## 2014-07-04 NOTE — Anesthesia Postprocedure Evaluation (Signed)
  Anesthesia Post-op Note  Patient: Kurt Avila  Procedure(s) Performed: Procedure(s) (LRB): ROBOTIC ASSISTED LAPAROSCOPIC RADICAL PROSTATECTOMY LEVEL 1 (N/A)  Patient Location: PACU  Anesthesia Type: General  Level of Consciousness: awake and alert   Airway and Oxygen Therapy: Patient Spontanous Breathing  Post-op Pain: mild  Post-op Assessment: Post-op Vital signs reviewed, Patient's Cardiovascular Status Stable, Respiratory Function Stable, Patent Airway and No signs of Nausea or vomiting  Last Vitals:  Filed Vitals:   07/04/14 1321  BP: 153/94  Pulse: 90  Temp: 36.7 C  Resp: 20    Post-op Vital Signs: stable   Complications: No apparent anesthesia complications

## 2014-07-04 NOTE — Op Note (Signed)
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 Robotic assisted laparoscopic radical prostatectomy (bilateral nerve sparing)  Surgeon: Roxy Horseman, Brooke Bonito. M.D.  Assistant: Debbrah Alar, PA-C  Anesthesia: General  Complications: None  EBL: 100 mL  IVF:  1500 mL crystalloid  Specimens: 1. Prostate and seminal vesicles  Disposition of specimens: Pathology  Drains: 1. 20 Fr coude catheter 2. # 19 Blake pelvic drain  Indication: Kurt Avila is a 58 y.o. year old patient with clinically localized prostate cancer.  After a thorough review of the management options for treatment of prostate cancer, he elected to proceed with surgical therapy and the above procedure(s).  We have discussed the potential benefits and risks of the procedure, side effects of the proposed treatment, the likelihood of the patient achieving the goals of the procedure, and any potential problems that might occur during the procedure or recuperation. Informed consent has been obtained.  Description of procedure:  The patient was taken to the operating room and a general anesthetic was administered. He was given preoperative antibiotics, placed in the dorsal lithotomy position, and prepped and draped in the usual sterile fashion. Next a preoperative timeout was performed. A urethral catheter was placed into the bladder and a site was selected near the umbilicus for placement of the camera port. This was placed using a standard open Hassan technique which allowed entry into the peritoneal cavity under direct vision and without difficulty. A 12 mm port was placed and a pneumoperitoneum established. The camera was then used to inspect the abdomen and there was no evidence of any intra-abdominal injuries or other abnormalities. The remaining  abdominal ports were then placed. 8 mm robotic ports were placed in the right lower quadrant, left lower quadrant, and far left lateral abdominal wall. A 5 mm port was placed in the right upper quadrant and a 12 mm port was placed in the right lateral abdominal wall for laparoscopic assistance. All ports were placed under direct vision without difficulty. The surgical cart was then docked.   Utilizing the cautery scissors, the bladder was reflected posteriorly allowing entry into the space of Retzius and identification of the endopelvic fascia and prostate. The periprostatic fat was then removed from the prostate allowing full exposure of the endopelvic fascia. The endopelvic fascia was then incised from the apex back to the base of the prostate bilaterally and the underlying levator muscle fibers were swept laterally off the prostate thereby isolating the dorsal venous complex. The dorsal vein was then stapled and divided with a 45 mm Flex Echelon stapler. Attention then turned to the bladder neck which was divided anteriorly thereby allowing entry into the bladder and exposure of the urethral catheter. The catheter balloon was deflated and the catheter was brought into the operative field and used to retract the prostate anteriorly. The posterior bladder neck was then examined and was divided allowing further dissection between the bladder and prostate posteriorly until the vasa deferentia and seminal vessels were identified. The vasa deferentia were isolated, divided, and lifted anteriorly. The seminal vesicles were dissected down to their tips with care to control the seminal vascular arterial blood supply. These structures were then lifted anteriorly and the space between Denonvillier's fascia and the anterior rectum was developed with a combination of sharp and blunt dissection. This isolated the vascular pedicles of the prostate.  The lateral prostatic  fascia was then sharply incised allowing release of  the neurovascular bundles bilaterally. The vascular pedicles of the prostate were then ligated with Weck clips between the prostate and neurovascular bundles and divided with sharp cold scissor dissection resulting in neurovascular bundle preservation. The neurovascular bundles were then separated off the apex of the prostate and urethra bilaterally.  The urethra was then sharply transected allowing the prostate specimen to be disarticulated. The pelvis was copiously irrigated and hemostasis was ensured. There was no evidence for rectal injury.  Attention then turned to the urethral anastomosis. A 2-0 Vicryl slip knot was placed between Denonvillier's fascia, the posterior bladder neck, and the posterior urethra to reapproximate these structures. A double-armed 3-0 Monocryl suture was then used to perform a 360 running tension-free anastomosis between the bladder neck and urethra. A new urethral catheter was then placed into the bladder and irrigated. There were no blood clots within the bladder and the anastomosis appeared to be watertight. A #19 Blake drain was then brought through the left lateral 8 mm port site and positioned appropriately within the pelvis. It was secured to the skin with a nylon suture. The surgical cart was then undocked. The right lateral 12 mm port site was closed at the fascial level with a 0 Vicryl suture placed laparoscopically. All remaining ports were then removed under direct vision. The prostate specimen was removed intact within the Endopouch retrieval bag via the periumbilical camera port site. This fascial opening was closed with two running 0 Vicryl sutures. 0.25% Marcaine was then injected into all port sites and all incisions were reapproximated at the skin level with 4-0 Monocryl subcuticular sutures and Dermabond. The patient appeared to tolerate the procedure well and without complications. The patient was able to be extubated and transferred to the recovery unit in  satisfactory condition.  Pryor Curia MD

## 2014-07-04 NOTE — Discharge Instructions (Signed)
1. Activity:  You are encouraged to ambulate frequently (about every hour during waking hours) to help prevent blood clots from forming in your legs or lungs.  However, you should not engage in any heavy lifting (> 10-15 lbs), strenuous activity, or straining. 2. Diet: You should continue a clear liquid diet until passing gas from below.  Once this occurs, you may advance your diet to a soft diet that would be easy to digest (i.e soups, scrambled eggs, mashed potatoes, etc.) for 24 hours just as you would if getting over a bad stomach flu.  If tolerating this diet well for 24 hours, you may then begin eating regular food.  It will be normal to have some amount of bloating, nausea, and abdominal discomfort intermittently. 3. Prescriptions:  You will be provided a prescription for pain medication to take as needed.  If your pain is not severe enough to require the prescription pain medication, you may take Tylenol instead.  You should also take an over the counter stool softener (Colace 100 mg twice daily) to avoid straining with bowel movements as the pain medication may constipate you. Finally, you will also be provided a prescription for an antibiotic to begin the day prior to your return visit in the office for catheter removal. 4. Catheter care: You will be taught how to take care of the catheter by the nursing staff prior to discharge from the hospital.  You may use both a leg bag and the larger bedside bag but it is recommended to at least use the bigger bedside bag at nighttime as the leg bag is small and will fill up overnight and also does not drain as well when lying flat. You may periodically feel a strong urge to void with the catheter in place.  This is a bladder spasm and most often can occur when having a bowel movement or when you are moving around. It is typically self-limited and usually will stop after a few minutes.  You may use some Vaseline or Neosporin around the tip of the catheter to  reduce friction at the tip of the penis. 5. Incisions: You may remove your dressing bandages the 2nd day after surgery.  You most likely will have a few small staples in each of the incisions and once the bandages are removed, the incisions may stay open to air.  You may start showering (not soaking or bathing in water) 48 hours after surgery and the incisions simply need to be patted dry after the shower.  No additional care is needed. 6. What to call us about: You should call the office [phone]) if you develop fever > 101, persistent vomiting, or the catheter stops draining. Also, feel free to call with any other questions you may have and remember the handout that was provided to you as a reference preoperatively which answers many of the common questions that arise after surgery. 7. You may resume aspirin, advil, aleve, vitamins, and supplements 7 days after surgery.  Speak with Dr. Alinda Money at your follow up appointment about when to resume Robaxin.  *Make an appointment with your primary care physician to evaluate and treat your high blood pressure.*

## 2014-07-04 NOTE — Progress Notes (Signed)
Patient ID: Kurt Avila, male   DOB: 1955/10/25, 58 y.o.   MRN: [id number] Post-op note  Subjective: The patient is doing well.  No complaints except neck discomfort.  He had a neck surgery in May 2015 and he states he still occasionally has some discomfort.  No N/V.  Objective: Vital signs in last 24 hours: Temp:  [98 F (36.7 C)-98.4 F (36.9 C)] 98.1 F (36.7 C) (12/14 1321) Pulse Rate:  [73-90] 90 (12/14 1321) Resp:  [11-20] 20 (12/14 1321) BP: (130-160)/(79-110) 153/94 mmHg (12/14 1321) SpO2:  [94 %-100 %] 97 % (12/14 1321) Weight:  [91.627 kg (202 lb)-91.967 kg (202 lb 12 oz)] 91.627 kg (202 lb) (12/14 1250)  Intake/Output from previous day:   Intake/Output this shift: Total I/O In: 3050 [I.V.:2050; IV Piggyback:1000] Out: 615 [Urine:475; Drains:40; Blood:100]  Physical Exam:  General: Alert and oriented. Abdomen: Soft, Nondistended. Incisions: Clean and dry. Urine: clear  Lab Results:  Recent Labs  07/04/14 1005  HGB 13.6  HCT 39.8    Assessment/Plan: POD#0   1) Continue to monitor  2) DVT prophy, clears, IS, amb, pain control  3) Heating pad to neck per pt request   LOS: 0 days   [place], [place] 07/04/2014, 2:44 PM

## 2014-07-04 NOTE — Transfer of Care (Signed)
Immediate Anesthesia Transfer of Care Note  Patient: Kurt Avila  Procedure(s) Performed: Procedure(s): ROBOTIC ASSISTED LAPAROSCOPIC RADICAL PROSTATECTOMY LEVEL 1 (N/A)  Patient Location: PACU  Anesthesia Type:General  Level of Consciousness: awake, alert  and oriented  Airway & Oxygen Therapy: Patient Spontanous Breathing and Patient connected to face mask oxygen  Post-op Assessment: Report given to PACU RN and Post -op Vital signs reviewed and stable  Post vital signs: Reviewed and stable  Complications: No apparent anesthesia complications

## 2014-07-04 NOTE — Anesthesia Procedure Notes (Signed)
Procedure Name: Intubation Date/Time: 07/04/2014 7:27 AM Performed by: Danley Danker L Patient Re-evaluated:Patient Re-evaluated prior to inductionOxygen Delivery Method: Circle system utilized Preoxygenation: Pre-oxygenation with 100% oxygen Intubation Type: IV induction Ventilation: Mask ventilation without difficulty and Oral airway inserted - appropriate to patient size Laryngoscope Size: Miller and 3 Grade View: Grade I Tube type: Oral Tube size: 8.0 mm Number of attempts: 1 Airway Equipment and Method: Stylet Placement Confirmation: ETT inserted through vocal cords under direct vision,  positive ETCO2 and breath sounds checked- equal and bilateral Secured at: 22 cm Tube secured with: Tape Dental Injury: Teeth and Oropharynx as per pre-operative assessment

## 2014-07-05 ENCOUNTER — Encounter (HOSPITAL_COMMUNITY): Payer: Self-pay | Admitting: Urology

## 2014-07-05 LAB — HEMOGLOBIN AND HEMATOCRIT, BLOOD
HEMATOCRIT: 35.9 % — AB (ref 39.0–52.0)
Hemoglobin: 12 g/dL — ABNORMAL LOW (ref 13.0–17.0)

## 2014-07-05 MED ORDER — HYDROCODONE-ACETAMINOPHEN 5-325 MG PO TABS
1.0000 | ORAL_TABLET | Freq: Four times a day (QID) | ORAL | Status: DC | PRN
  Administered 2014-07-05: 2 via ORAL
  Filled 2014-07-05: qty 2

## 2014-07-05 MED ORDER — BISACODYL 10 MG RE SUPP
10.0000 mg | Freq: Once | RECTAL | Status: AC
  Administered 2014-07-05: 10 mg via RECTAL
  Filled 2014-07-05: qty 1

## 2014-07-05 NOTE — Progress Notes (Signed)
Patient ID: Kurt Avila, male   DOB: 22-Aug-1955, 58 y.o.   MRN: [id number]  1 Day Post-Op Subjective: The patient is doing well.  No nausea or vomiting. Pain is adequately controlled. Complains of some abdominal distention.  Objective: Vital signs in last 24 hours: Temp:  [98.1 F (36.7 C)-98.7 F (37.1 C)] 98.7 F (37.1 C) (12/15 0621) Pulse Rate:  [67-94] 78 (12/15 0621) Resp:  [11-20] 20 (12/15 0621) BP: (128-160)/(78-110) 136/87 mmHg (12/15 0621) SpO2:  [93 %-100 %] 99 % (12/15 0621) Weight:  [91.627 kg (202 lb)] 91.627 kg (202 lb) (12/14 1250)  Intake/Output from previous day: 12/14 0701 - 12/15 0700 In: 4882.5 [P.O.:240; I.V.:3617.5; IV Piggyback:1000] Out: 2760 [Urine:2525; Drains:135; Blood:100] Intake/Output this shift:    Physical Exam:  General: Alert and oriented. CV: RRR Lungs: Clear bilaterally. GI: Soft, Nondistended., Positive bowel sounds. Incisions: Clean, dry, and intact Urine: Clear Extremities: Nontender, no erythema, no edema.  Lab Results:  Recent Labs  07/04/14 1005 07/05/14 0504  HGB 13.6 12.0*  HCT 39.8 35.9*      Assessment/Plan: POD# 1 s/p robotic prostatectomy.  1) SL IVF 2) Ambulate, Incentive spirometry 3) Transition to oral pain medication 4) Dulcolax suppository 5) D/C pelvic drain 6) Plan for likely discharge later today   Kurt Avila. MD   LOS: 1 day   Kurt Avila,LES 07/05/2014, 7:20 AM

## 2014-07-05 NOTE — Care Management Note (Signed)
    Page 1 of 1   07/05/2014     2:23:21 PM CARE MANAGEMENT NOTE 07/05/2014  Patient:  Kurt Avila, Kurt Avila   Account Number:  [id number]  Date Initiated:  07/05/2014  Documentation initiated by:  Dessa Phi  Subjective/Objective Assessment:   58 y/o m admitted w/prostate ca.     Action/Plan:   From home.   Anticipated DC Date:  07/05/2014   Anticipated DC Plan:  [place]  CM consult      Choice offered to / List presented to:             Status of service:  Completed, signed off Medicare Important Message given?   (If response is "NO", the following Medicare IM given date fields will be blank) Date Medicare IM given:   Medicare IM given by:   Date Additional Medicare IM given:   Additional Medicare IM given by:    Discharge Disposition:  HOME/SELF CARE  Per UR Regulation:  Reviewed for med. necessity/level of care/duration of stay  If discussed at [place] of Stay Meetings, dates discussed:    Comments:

## 2014-07-05 NOTE — Discharge Summary (Signed)
  Date of admission: 07/04/2014  Date of discharge: 07/05/2014  Admission diagnosis: Prostate Cancer  Discharge diagnosis: Prostate Cancer  History and Physical: For full details, please see admission history and physical. Briefly, Kurt Avila is a 58 y.o. gentleman with localized prostate cancer.  After discussing management/treatment options, he elected to proceed with surgical treatment.  Hospital Course: Mj EDYN QAZI was taken to the operating room on 07/04/2014 and underwent a robotic assisted laparoscopic radical prostatectomy. He tolerated this procedure well and without complications. Postoperatively, he was able to be transferred to a regular hospital room following recovery from anesthesia.  He was able to begin ambulating the night of surgery. He remained hemodynamically stable overnight.  He had excellent urine output with appropriately minimal output from his pelvic drain and his pelvic drain was removed on POD #1.  He was transitioned to oral pain medication, tolerated a clear liquid diet, and had met all discharge criteria and was able to be discharged home later on POD#1.  Laboratory values:  Recent Labs  07/04/14 1005 07/05/14 0504  HGB 13.6 12.0*  HCT 39.8 35.9*    Disposition: Home  Discharge instruction: He was instructed to be ambulatory but to refrain from heavy lifting, strenuous activity, or driving. He was instructed on urethral catheter care.  Discharge medications:     Medication List    STOP taking these medications        methocarbamol 500 MG tablet  Commonly known as:  ROBAXIN      TAKE these medications        ciprofloxacin 500 MG tablet  Commonly known as:  CIPRO  Take 1 tablet (500 mg total) by mouth 2 (two) times daily. Start day prior to office visit for foley removal     metoprolol tartrate 25 MG tablet  Commonly known as:  LOPRESSOR  Take 0.5 tablets (12.5 mg total) by mouth 2 (two) times daily.     oxycodone 5 MG capsule  Commonly  known as:  OXY-IR  Take 1 capsule (5 mg total) by mouth every 4 (four) hours as needed for pain.        Followup: He will followup in 1 week for catheter removal and to discuss his surgical pathology results.

## 2014-07-19 ENCOUNTER — Other Ambulatory Visit: Payer: Self-pay | Admitting: Internal Medicine

## 2014-07-19 DIAGNOSIS — K7010 Alcoholic hepatitis without ascites: Secondary | ICD-10-CM

## 2014-08-09 ENCOUNTER — Ambulatory Visit
Admission: RE | Admit: 2014-08-09 | Discharge: 2014-08-09 | Disposition: A | Discharge status: Home / Self Care | Payer: Self-pay | Source: Ambulatory Visit | Attending: Internal Medicine | Admitting: Internal Medicine

## 2014-08-09 DIAGNOSIS — K7010 Alcoholic hepatitis without ascites: Secondary | ICD-10-CM

## 2015-02-03 ENCOUNTER — Other Ambulatory Visit: Payer: Self-pay | Admitting: Internal Medicine

## 2015-02-03 DIAGNOSIS — K769 Liver disease, unspecified: Secondary | ICD-10-CM

## 2015-02-10 ENCOUNTER — Ambulatory Visit
Admission: RE | Admit: 2015-02-10 | Discharge: 2015-02-10 | Disposition: A | Discharge status: Home / Self Care | Payer: 59 | Source: Ambulatory Visit | Attending: Internal Medicine | Admitting: Internal Medicine

## 2015-02-10 DIAGNOSIS — K769 Liver disease, unspecified: Secondary | ICD-10-CM

## 2018-12-24 ENCOUNTER — Ambulatory Visit
Admission: RE | Admit: 2018-12-24 | Discharge: 2018-12-24 | Disposition: A | Discharge status: Home / Self Care | Payer: 59 | Source: Ambulatory Visit | Attending: Orthopedic Surgery | Admitting: Orthopedic Surgery

## 2018-12-24 ENCOUNTER — Other Ambulatory Visit: Payer: Self-pay | Admitting: Orthopedic Surgery

## 2018-12-24 ENCOUNTER — Other Ambulatory Visit: Payer: Self-pay

## 2018-12-24 DIAGNOSIS — G8929 Other chronic pain: Secondary | ICD-10-CM

## 2019-01-07 ENCOUNTER — Other Ambulatory Visit: Payer: Self-pay | Admitting: Neurosurgery

## 2019-01-07 DIAGNOSIS — M5416 Radiculopathy, lumbar region: Secondary | ICD-10-CM

## 2019-01-08 ENCOUNTER — Other Ambulatory Visit: Payer: Self-pay

## 2019-01-08 ENCOUNTER — Ambulatory Visit
Admission: RE | Admit: 2019-01-08 | Discharge: 2019-01-08 | Disposition: A | Discharge status: Home / Self Care | Payer: 59 | Source: Ambulatory Visit | Attending: Neurosurgery | Admitting: Neurosurgery

## 2019-01-08 DIAGNOSIS — M5416 Radiculopathy, lumbar region: Secondary | ICD-10-CM

## 2019-01-08 MED ORDER — IOPAMIDOL (ISOVUE-M 200) INJECTION 41%
1.0000 mL | Freq: Once | INTRAMUSCULAR | Status: AC
  Administered 2019-01-08: 1 mL via EPIDURAL

## 2019-01-08 MED ORDER — METHYLPREDNISOLONE ACETATE 40 MG/ML INJ SUSP (RADIOLOG
120.0000 mg | Freq: Once | INTRAMUSCULAR | Status: AC
  Administered 2019-01-08: 11:00:00 120 mg via EPIDURAL

## 2019-01-08 NOTE — Discharge Instructions (Signed)

## 2019-09-19 ENCOUNTER — Ambulatory Visit: Payer: Self-pay | Attending: Internal Medicine

## 2019-09-19 DIAGNOSIS — Z23 Encounter for immunization: Secondary | ICD-10-CM | POA: Insufficient documentation

## 2019-09-19 NOTE — Progress Notes (Signed)
   Covid-19 Vaccination Clinic  Name:  Kurt Avila    MRN: [id number] DOB: 1955-12-18  09/19/2019  Mr. Kurt Avila was observed post Covid-19 immunization for 15 minutes without incidence. He was provided with Vaccine Information Sheet and instruction to access the V-Safe system.   Mr. Kurt Avila was instructed to call 911 with any severe reactions post vaccine: Marland Kitchen Difficulty breathing  . Swelling of your face and throat  . A fast heartbeat  . A bad rash all over your body  . Dizziness and weakness    Immunizations Administered    Name Date Dose VIS Date Route   Pfizer COVID-19 Vaccine 09/19/2019 11:44 AM 0.3 mL 07/02/2019 Intramuscular   Manufacturer: [place]   Lot: [id number]   [place]: [id number]

## 2019-10-18 ENCOUNTER — Ambulatory Visit: Payer: Self-pay | Attending: Internal Medicine

## 2019-10-18 DIAGNOSIS — Z23 Encounter for immunization: Secondary | ICD-10-CM

## 2019-10-18 NOTE — Progress Notes (Signed)
   Covid-19 Vaccination Clinic  Name:  Kurt Avila    MRN: [id number] DOB: 1955/11/01  10/18/2019  [name] was observed post Covid-19 immunization for 15 minutes without incident. He was provided with Vaccine Information Sheet and instruction to access the V-Safe system.   [name] was instructed to call 911 with any severe reactions post vaccine: Marland Kitchen Difficulty breathing  . Swelling of face and throat  . A fast heartbeat  . A bad rash all over body  . Dizziness and weakness   Immunizations Administered    Name Date Dose VIS Date Route   Pfizer COVID-19 Vaccine 10/18/2019  8:08 AM 0.3 mL 07/02/2019 Intramuscular   Manufacturer: [place]   Lot: [id number]   [place]: [id number]

## 2020-01-07 ENCOUNTER — Other Ambulatory Visit: Payer: Self-pay

## 2020-01-07 ENCOUNTER — Ambulatory Visit (INDEPENDENT_AMBULATORY_CARE_PROVIDER_SITE_OTHER): Payer: Managed Care, Other (non HMO) | Admitting: Podiatry

## 2020-01-07 ENCOUNTER — Encounter: Payer: Self-pay | Admitting: Podiatry

## 2020-01-07 DIAGNOSIS — L923 Foreign body granuloma of the skin and subcutaneous tissue: Secondary | ICD-10-CM

## 2020-01-09 NOTE — Progress Notes (Signed)
Subjective:   Patient ID: Kurt Avila, male   DOB: 64 y.o.   MRN: [id number]   HPI Patient states he is developed a lot of pain in the bottom of his left heel and its been recent and is wondering if it is a wart or could be something else or possible foreign body.  States he is having trouble bearing weight on his heel and patient does not currently smoke likes to be active   Review of Systems  All other systems reviewed and are negative.       Objective:  Physical Exam Vitals and nursing note reviewed.  Constitutional:      Appearance: He is well-developed.  Pulmonary:     Effort: Pulmonary effort is normal.  Musculoskeletal:        General: Normal range of motion.  Skin:    General: Skin is warm.  Neurological:     Mental Status: He is alert.     Neurovascular status intact muscle strength was found to be adequate range of motion was within normal limits.  Patient is noted to have an area of damage on the plantar aspect left foot that measures about 4 x 4 millimeter is very discrete painful with some redness associated with it.  It is localized with no proximal edema erythema or any systemic signs of infection     Assessment:  Possibility that we may be dealing with a foreign body left     Plan:  H&P education rendered proximal nerve block and sterile prep applied.  Using sharp instrumentation I did open the area up and I found there to be a small foreign body which is most likely a piece of metal.  I did then flushed out the area I applied sterile dressing gave instructions on soaks and if any issues were to occur patient is to contact us immediately

## 2021-01-08 ENCOUNTER — Encounter (HOSPITAL_COMMUNITY): Payer: Self-pay | Admitting: *Deleted

## 2021-01-08 ENCOUNTER — Emergency Department (HOSPITAL_COMMUNITY): Payer: Managed Care, Other (non HMO)

## 2021-01-08 ENCOUNTER — Other Ambulatory Visit: Payer: Self-pay

## 2021-01-08 ENCOUNTER — Emergency Department (HOSPITAL_COMMUNITY)
Admission: EM | Admit: 2021-01-08 | Discharge: 2021-01-08 | Disposition: A | Discharge status: Home / Self Care | Payer: Managed Care, Other (non HMO) | Attending: Emergency Medicine | Admitting: Emergency Medicine

## 2021-01-08 DIAGNOSIS — R0602 Shortness of breath: Secondary | ICD-10-CM | POA: Insufficient documentation

## 2021-01-08 DIAGNOSIS — Z79899 Other long term (current) drug therapy: Secondary | ICD-10-CM | POA: Diagnosis not present

## 2021-01-08 DIAGNOSIS — Z8546 Personal history of malignant neoplasm of prostate: Secondary | ICD-10-CM | POA: Insufficient documentation

## 2021-01-08 DIAGNOSIS — I10 Essential (primary) hypertension: Secondary | ICD-10-CM | POA: Diagnosis not present

## 2021-01-08 DIAGNOSIS — Z87891 Personal history of nicotine dependence: Secondary | ICD-10-CM | POA: Diagnosis not present

## 2021-01-08 DIAGNOSIS — R079 Chest pain, unspecified: Secondary | ICD-10-CM

## 2021-01-08 DIAGNOSIS — E871 Hypo-osmolality and hyponatremia: Secondary | ICD-10-CM | POA: Insufficient documentation

## 2021-01-08 LAB — COMPREHENSIVE METABOLIC PANEL
ALT: 43 U/L (ref 0–44)
AST: 31 U/L (ref 15–41)
Albumin: 3.9 g/dL (ref 3.5–5.0)
Alkaline Phosphatase: 112 U/L (ref 38–126)
Anion gap: 13 (ref 5–15)
BUN: 6 mg/dL — ABNORMAL LOW (ref 8–23)
CO2: 26 mmol/L (ref 22–32)
Calcium: 9.5 mg/dL (ref 8.9–10.3)
Chloride: 91 mmol/L — ABNORMAL LOW (ref 98–111)
Creatinine, Ser: 0.57 mg/dL — ABNORMAL LOW (ref 0.61–1.24)
GFR, Estimated: >60 mL/min (ref >60)
Glucose, Bld: 108 mg/dL — ABNORMAL HIGH (ref 70–99)
Potassium: 3.3 mmol/L — ABNORMAL LOW (ref 3.5–5.1)
Sodium: 130 mmol/L — ABNORMAL LOW (ref 135–145)
Total Bilirubin: 1 mg/dL (ref 0.3–1.2)
Total Protein: 8.1 g/dL (ref 6.5–8.1)

## 2021-01-08 LAB — CBC WITH DIFFERENTIAL/PLATELET
Abs Immature Granulocytes: 0.07 10*3/uL (ref 0.00–0.07)
Basophils Absolute: 0 10*3/uL (ref 0.0–0.1)
Basophils Relative: 0 %
Eosinophils Absolute: 0 10*3/uL (ref 0.0–0.5)
Eosinophils Relative: 0 %
HCT: 39.6 % (ref 39.0–52.0)
Hemoglobin: 13.7 g/dL (ref 13.0–17.0)
Immature Granulocytes: 1 %
Lymphocytes Relative: 10 %
Lymphs Abs: 1 10*3/uL (ref 0.7–4.0)
MCH: 33.2 pg (ref 26.0–34.0)
MCHC: 34.6 g/dL (ref 30.0–36.0)
MCV: 95.9 fL (ref 80.0–100.0)
Monocytes Absolute: 0.9 10*3/uL (ref 0.1–1.0)
Monocytes Relative: 9 %
Neutro Abs: 8.1 10*3/uL — ABNORMAL HIGH (ref 1.7–7.7)
Neutrophils Relative %: 80 %
Platelets: 286 10*3/uL (ref 150–400)
RBC: 4.13 MIL/uL — ABNORMAL LOW (ref 4.22–5.81)
RDW: 12.1 % (ref 11.5–15.5)
WBC: 10.2 10*3/uL (ref 4.0–10.5)
nRBC: 0 % (ref 0.0–0.2)

## 2021-01-08 LAB — D-DIMER, QUANTITATIVE: D-Dimer, Quant: 0.76 ug/mL-FEU — ABNORMAL HIGH (ref 0.00–0.50)

## 2021-01-08 LAB — PROTIME-INR
INR: 1 (ref 0.8–1.2)
Prothrombin Time: 13.5 seconds (ref 11.4–15.2)

## 2021-01-08 LAB — LIPASE, BLOOD: Lipase: 26 U/L (ref 11–51)

## 2021-01-08 LAB — TROPONIN I (HIGH SENSITIVITY)
Troponin I (High Sensitivity): 10 ng/L (ref <18)
Troponin I (High Sensitivity): 14 ng/L (ref <18)

## 2021-01-08 MED ORDER — ESOMEPRAZOLE MAGNESIUM 40 MG PO CPDR: 40.0000 mg | DELAYED_RELEASE_CAPSULE | Freq: Every day | ORAL | 0 refills | Status: DC

## 2021-01-08 MED ORDER — ASPIRIN 81 MG PO CHEW
81.0000 mg | CHEWABLE_TABLET | Freq: Once | ORAL | Status: AC
  Administered 2021-01-08: 81 mg via ORAL
  Filled 2021-01-08: qty 1

## 2021-01-08 MED ORDER — ASPIRIN 81 MG PO CHEW
243.0000 mg | CHEWABLE_TABLET | Freq: Once | ORAL | Status: AC
  Administered 2021-01-08: 243 mg via ORAL
  Filled 2021-01-08: qty 3

## 2021-01-08 MED ORDER — ALUM & MAG HYDROXIDE-SIMETH 200-200-20 MG/5ML PO SUSP
30.0000 mL | Freq: Once | ORAL | Status: AC
  Administered 2021-01-08: 30 mL via ORAL
  Filled 2021-01-08: qty 30

## 2021-01-08 MED ORDER — SODIUM CHLORIDE 0.9 % IV BOLUS: 500.0000 mL | Freq: Once | INTRAVENOUS | Status: DC

## 2021-01-08 MED ORDER — LIDOCAINE VISCOUS HCL 2 % MT SOLN
15.0000 mL | Freq: Once | OROMUCOSAL | Status: AC
  Administered 2021-01-08: 15 mL via ORAL
  Filled 2021-01-08: qty 15

## 2021-01-08 MED ORDER — POTASSIUM CHLORIDE CRYS ER 20 MEQ PO TBCR
40.0000 meq | EXTENDED_RELEASE_TABLET | Freq: Once | ORAL | Status: AC
  Administered 2021-01-08: 40 meq via ORAL
  Filled 2021-01-08: qty 2

## 2021-01-08 MED ORDER — IOHEXOL 350 MG/ML SOLN
75.0000 mL | Freq: Once | INTRAVENOUS | Status: AC | PRN
  Administered 2021-01-08: 75 mL via INTRAVENOUS

## 2021-01-08 NOTE — ED Notes (Signed)
Patient transported to CT 

## 2021-01-08 NOTE — ED Provider Notes (Signed)
Emergency Medicine Provider Triage Evaluation Note  Kurt Avila , a 65 y.o. male  was evaluated in triage.  Pt complains of CP.  Began yesterday.  Pain is constant.  No radiation of Pain. No lower extremity swelling.  No history of PE or DVT.  No PND orthopnea  No know cardiac issues per patient  Review of Systems  Positive:  CP, SOB Negative: Cough, LE edema, back pain  Physical Exam  BP (!) 182/110 (BP Location: Right Arm)   Pulse 95   Temp 98.8 F (37.1 C)   Resp 17   SpO2 97%  Gen:   Awake, no distress   Resp:  Normal effort  MSK:   Moves extremities without difficulty  Other:    Medical Decision Making  Medically screening exam initiated at 11:10 AM.  Appropriate orders placed.  Oseph L Kopf was informed that the remainder of the evaluation will be completed by another provider, this initial triage assessment does not replace that evaluation, and the importance of remaining in the ED until their evaluation is complete.  CP, SOB   Kaya Pottenger A, PA-C 01/08/21 1112    Charlesetta Shanks, MD 01/17/21 [phone]

## 2021-01-08 NOTE — ED Notes (Signed)
Patient transported to X-ray 

## 2021-01-08 NOTE — ED Triage Notes (Signed)
Pt reports mid chest tightness and sob that started last night. Denies recent swelling. No acute distress is noted at triage.
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
## 2021-01-08 NOTE — Discharge Instructions (Addendum)
You were seen in the emergency department today for chest pain. Your work-up in the emergency department has been overall reassuring. Your labs have been fairly normal and or similar to previous blood work you have had done. Your EKG and the enzyme we use to check your heart did not show an acute heart attack at this time. Your chest x-ray was normal.   Take Nexium daily at least 30 minutes before your first meal of the day to help with potential reflux which may be contributing to your symptoms, use Tums or Maalox for breakthrough symptoms.  We would like you to follow up closely with your primary care provider and/or the cardiologist provided in your discharge instructions within 1-3 days. Return to the ER immediately should you experience any new or worsening symptoms including but not limited to return of pain, worsened pain, vomiting, shortness of breath, dizziness, lightheadedness, passing out, or any other concerns that you may have.

## 2021-01-12 ENCOUNTER — Other Ambulatory Visit: Payer: Self-pay

## 2021-01-12 ENCOUNTER — Ambulatory Visit (INDEPENDENT_AMBULATORY_CARE_PROVIDER_SITE_OTHER): Payer: Managed Care, Other (non HMO) | Admitting: Cardiology

## 2021-01-12 ENCOUNTER — Encounter: Payer: Self-pay | Admitting: Cardiology

## 2021-01-12 VITALS — BP 148/84 | HR 77 | Resp 18 | Ht 69.0 in | Wt 206.0 lb

## 2021-01-12 DIAGNOSIS — R079 Chest pain, unspecified: Secondary | ICD-10-CM

## 2021-01-12 DIAGNOSIS — I10 Essential (primary) hypertension: Secondary | ICD-10-CM

## 2021-01-12 DIAGNOSIS — Z7189 Other specified counseling: Secondary | ICD-10-CM
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
## 2021-01-12 NOTE — Patient Instructions (Signed)
Medication Instructions:  Your Physician recommend you continue on your current medication as directed.    *If you need a refill on your cardiac medications before your next appointment, please call your pharmacy*   Lab Work: None ordered today   Testing/Procedures: None ordered today   Follow-Up: At [facility], you and your health needs are our priority.  As part of our continuing mission to provide you with exceptional heart care, we have created designated Provider Care Teams.  These Care Teams include your primary Cardiologist (physician) and Advanced Practice Providers (APPs -  Physician Assistants and Nurse Practitioners) who all work together to provide you with the care you need, when you need it.  We recommend signing up for the patient portal called "MyChart".  Sign up information is provided on this After Visit Summary.  MyChart is used to connect with patients for Virtual Visits (Telemedicine).  Patients are able to view lab/test results, encounter notes, upcoming appointments, etc.  Non-urgent messages can be sent to your provider as well.   To learn more about what you can do with MyChart, go to [email or web address].    Your next appointment:   As needed @ [address] [place], [place] [address]   The format for your next appointment:   In Person  Provider:   Buford Dresser, MD

## 2022-10-31 IMAGING — CT CT ANGIO CHEST
3 of 7 series · 19 of 36 positions shown · IV contrast (omnipaque)
Comparison: Chest radiograph dated 01/08/2021.

CLINICAL DATA: [age] male with concern for pulmonary
embolism.

EXAM:
CT ANGIOGRAPHY CHEST WITH CONTRAST
TECHNIQUE: Multidetector CT imaging of the chest was performed using the
standard protocol during bolus administration of intravenous
contrast. Multiplanar CT image reconstructions and MIPs were
obtained to evaluate the vascular anatomy.
CONTRAST:  75mL OMNIPAQUE IOHEXOL 350 MG/ML SOLN

[Series 6: pe thins · axial · 0.85mm/px · z∈[+1221,+1481]mm · 15 of 426 slices shown]
[im 27/426  lung]
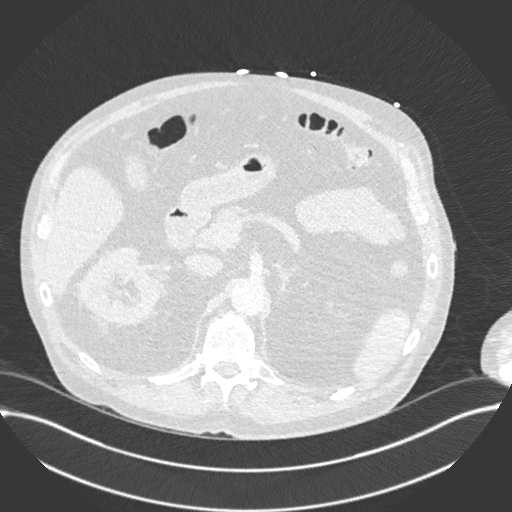
[im 54/426  mediastinal]
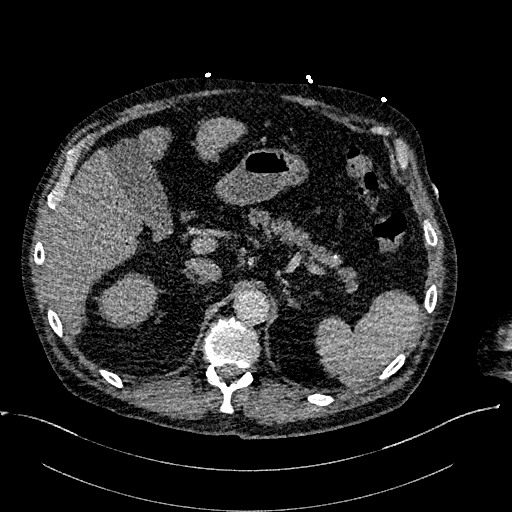
[im 80/426  lung]
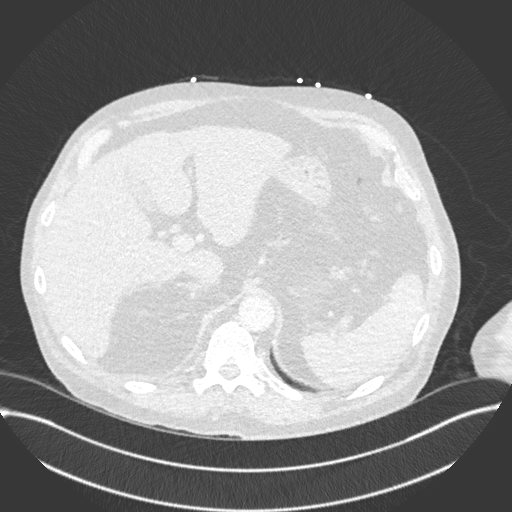
[im 107/426  mediastinal]
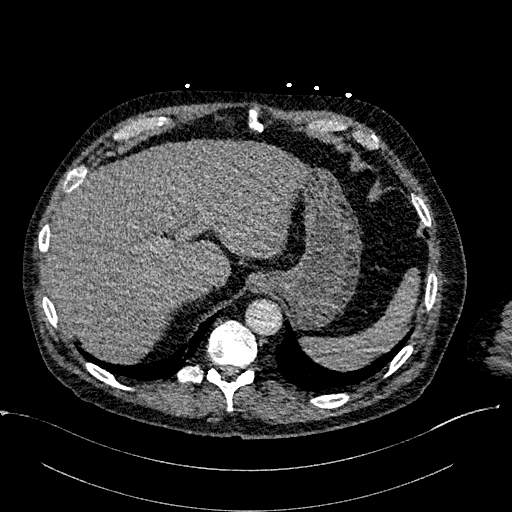
[im 133/426  lung]
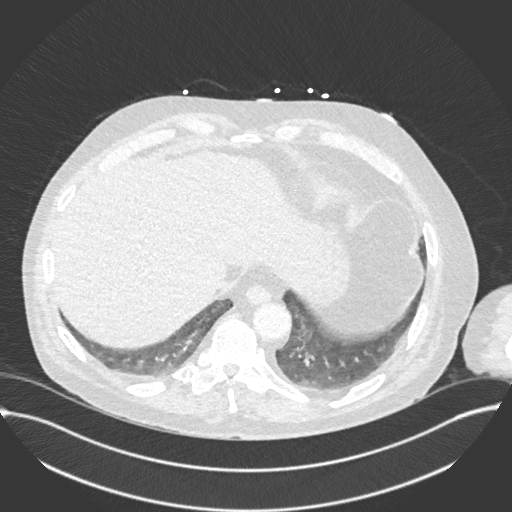
[im 160/426  mediastinal]
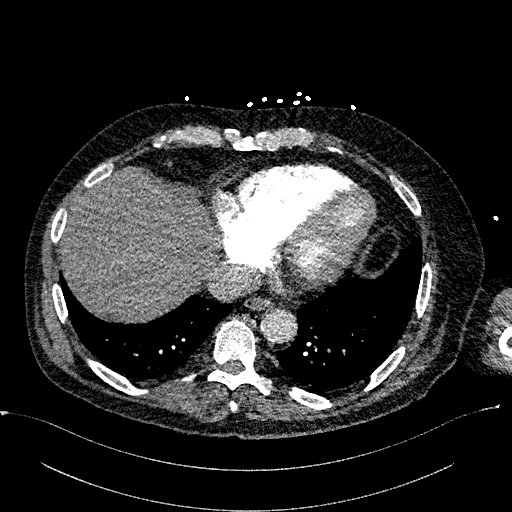
[im 186/426  lung]
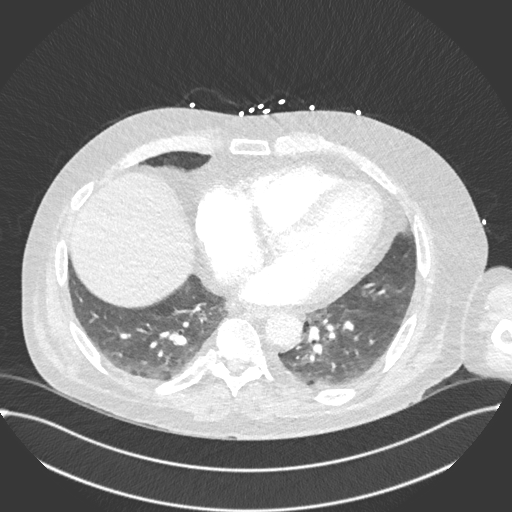
[im 213/426  mediastinal]
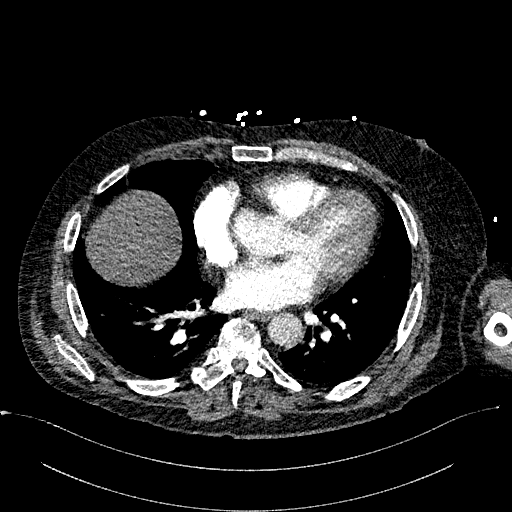
[im 240/426  lung]
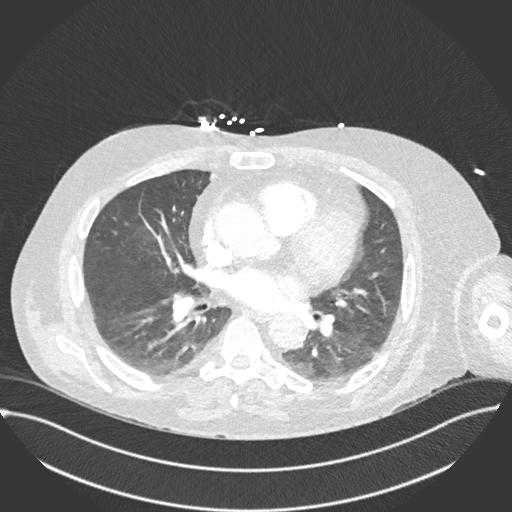
[im 266/426  mediastinal]
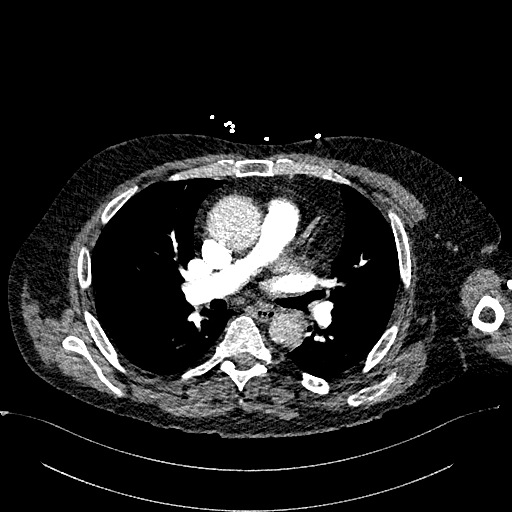
[im 293/426  lung]
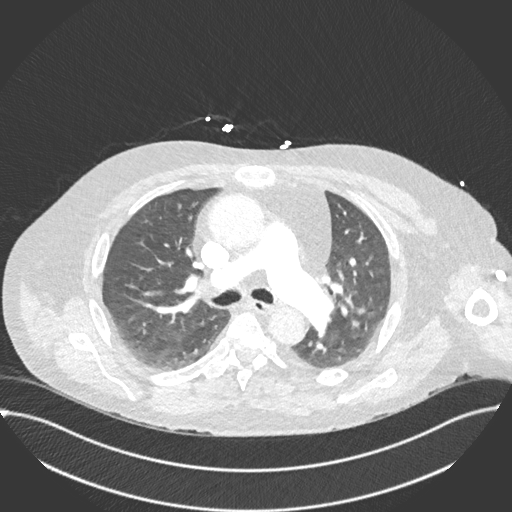
[im 319/426  mediastinal]
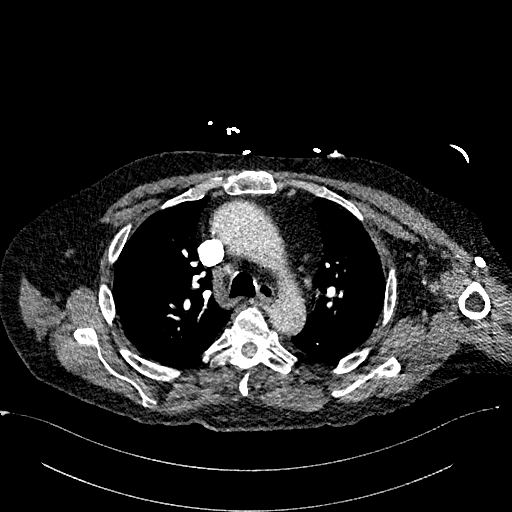
[im 346/426  lung]
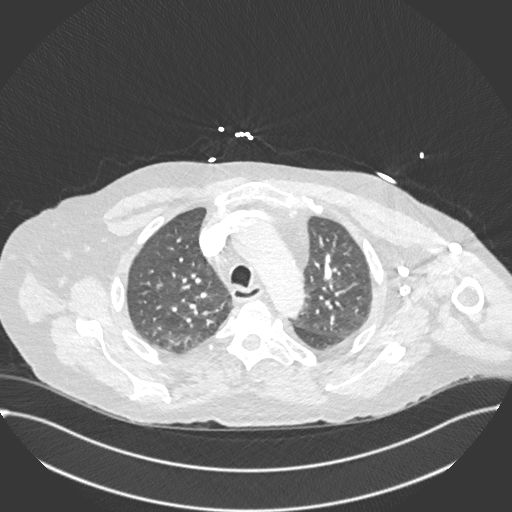
[im 372/426  mediastinal]
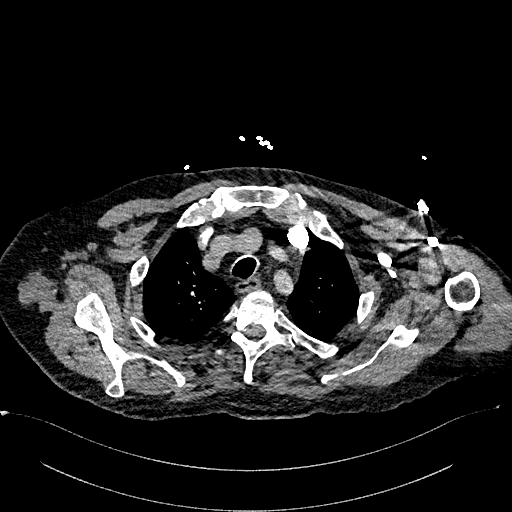
[im 399/426  lung]
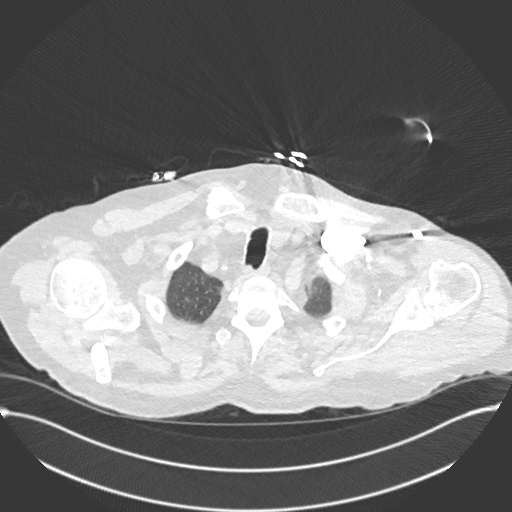

[Series 7: pe 2mm cor · coronal · 0.59mm/px · 1 of 151 slices shown]
[im 76/151  mediastinal]
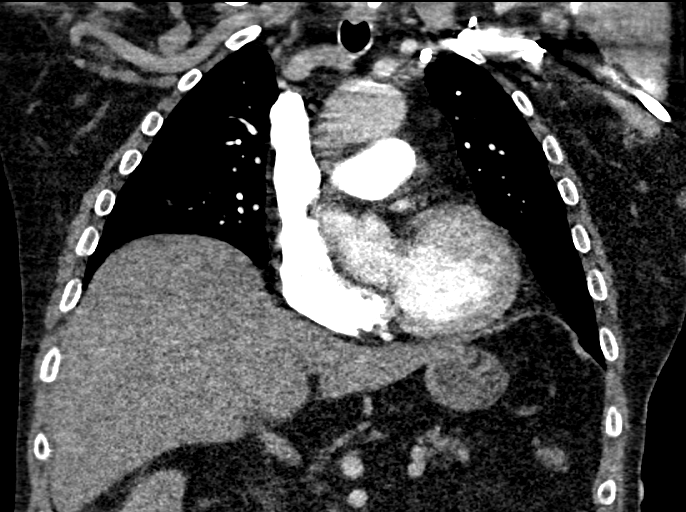

[Series 11: pe lung · axial · 0.85mm/px · z∈[+1314,+1438]mm · 3 of 125 slices shown]
[im 32/125  mediastinal]
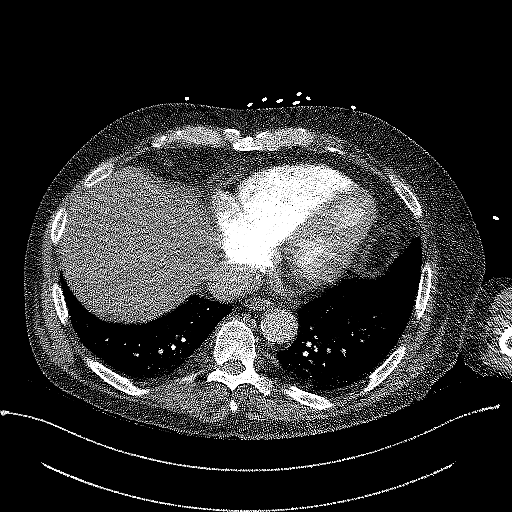
[im 63/125  mediastinal]
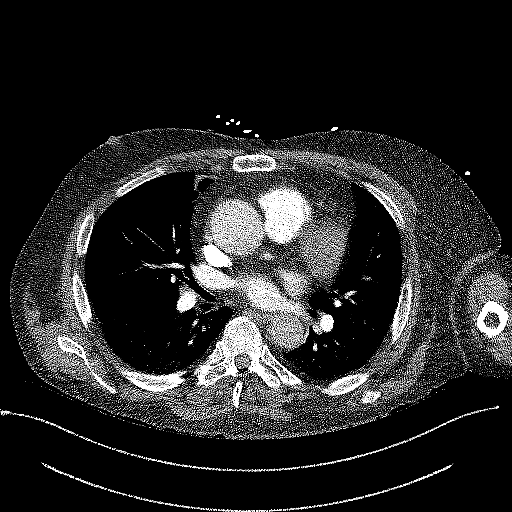
[im 94/125  mediastinal]
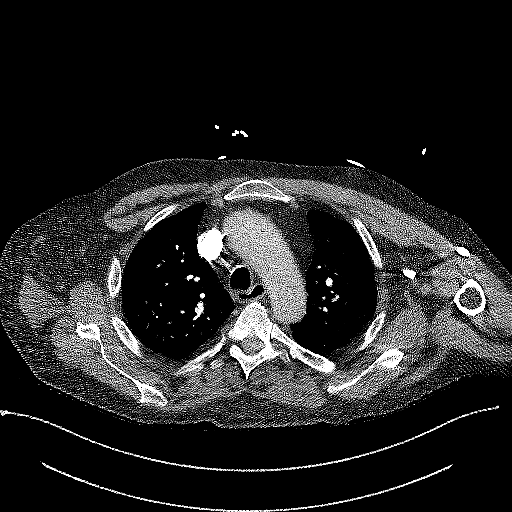

[19 of 36 positions shown; findings below may reference images not displayed]

FINDINGS: Cardiovascular: There is no cardiomegaly or pericardial effusion.
The thoracic aorta is unremarkable. No pulmonary artery embolus
identified.

Mediastinum/Nodes: There is no hilar or mediastinal adenopathy. The
esophagus is grossly unremarkable. No mediastinal fluid collection.

Lungs/Pleura: No focal consolidation, pleural effusion, or
pneumothorax. The central airways are patent.

Upper Abdomen: Probable cirrhosis.

Musculoskeletal: No chest wall abnormality. No acute or significant
osseous findings.

Review of the MIP images confirms the above findings.
IMPRESSION: No acute intrathoracic pathology. No CT evidence of pulmonary artery
embolus.
